# Patient Record
Sex: Female | Born: 1980
Health system: Southern US, Community
[De-identification: ages and names within clinical notes are randomized; demographics above are authoritative.]

## PROBLEM LIST (undated history)

## (undated) HISTORY — PX: WRIST SURGERY: SHX841

---

## 2013-06-15 ENCOUNTER — Encounter (HOSPITAL_COMMUNITY): Payer: Self-pay | Admitting: Emergency Medicine

## 2013-06-15 ENCOUNTER — Emergency Department (HOSPITAL_COMMUNITY)
Admission: EM | Admit: 2013-06-15 | Discharge: 2013-06-15 | Disposition: A | Discharge status: Home / Self Care | Payer: Managed Care, Other (non HMO) | Attending: Emergency Medicine | Admitting: Emergency Medicine

## 2013-06-15 DIAGNOSIS — T368X5A Adverse effect of other systemic antibiotics, initial encounter: Secondary | ICD-10-CM | POA: Insufficient documentation

## 2013-06-15 DIAGNOSIS — F17200 Nicotine dependence, unspecified, uncomplicated: Secondary | ICD-10-CM | POA: Insufficient documentation

## 2013-06-15 DIAGNOSIS — R000 Tachycardia, unspecified: Secondary | ICD-10-CM | POA: Insufficient documentation

## 2013-06-15 DIAGNOSIS — R221 Localized swelling, mass and lump, neck: Principal | ICD-10-CM

## 2013-06-15 DIAGNOSIS — T7840XA Allergy, unspecified, initial encounter: Secondary | ICD-10-CM

## 2013-06-15 DIAGNOSIS — R220 Localized swelling, mass and lump, head: Secondary | ICD-10-CM | POA: Insufficient documentation

## 2013-06-15 DIAGNOSIS — IMO0002 Reserved for concepts with insufficient information to code with codable children: Secondary | ICD-10-CM | POA: Insufficient documentation

## 2013-06-15 DIAGNOSIS — L299 Pruritus, unspecified: Secondary | ICD-10-CM | POA: Insufficient documentation

## 2013-06-15 MED ORDER — FAMOTIDINE 20 MG PO TABS: 20.0000 mg | ORAL_TABLET | Freq: Two times a day (BID) | ORAL | Status: DC

## 2013-06-15 MED ORDER — PREDNISONE 20 MG PO TABS
60.0000 mg | ORAL_TABLET | Freq: Once | ORAL | Status: AC
  Administered 2013-06-15: 60 mg via ORAL
  Filled 2013-06-15: qty 3

## 2013-06-15 MED ORDER — FAMOTIDINE 20 MG PO TABS
40.0000 mg | ORAL_TABLET | Freq: Once | ORAL | Status: AC
  Administered 2013-06-15: 40 mg via ORAL
  Filled 2013-06-15: qty 2

## 2013-06-15 MED ORDER — PREDNISONE 10 MG PO TABS: 20.0000 mg | ORAL_TABLET | Freq: Every day | ORAL | Status: DC

## 2013-06-15 NOTE — ED Notes (Signed)
Pt finished taking Amoxicillin for sinus infection yesterday. Began having hives and itching yesterday. Pt was seen at urgent care, declined prednisone. Benadryl taken 30 min ago.

## 2013-06-15 NOTE — Discharge Instructions (Signed)
Allergies °Allergies may happen from anything your body is sensitive to. This may be food, medicines, pollens, chemicals, and nearly anything around you in everyday life that produces allergens. An allergen is anything that causes an allergy producing substance. Heredity is often a factor in causing these problems. This means you may have some of the same allergies as your parents. °Food allergies happen in all age groups. Food allergies are some of the most severe and life threatening. Some common food allergies are cow's milk, seafood, eggs, nuts, wheat, and soybeans. °SYMPTOMS  °· Swelling around the mouth. °· An itchy red rash or hives. °· Vomiting or diarrhea. °· Difficulty breathing. °SEVERE ALLERGIC REACTIONS ARE LIFE-THREATENING. °This reaction is called anaphylaxis. It can cause the mouth and throat to swell and cause difficulty with breathing and swallowing. In severe reactions only a trace amount of food (for example, peanut oil in a salad) may cause death within seconds. °Seasonal allergies occur in all age groups. These are seasonal because they usually occur during the same season every year. They may be a reaction to molds, grass pollens, or tree pollens. Other causes of problems are house dust mite allergens, pet dander, and mold spores. The symptoms often consist of nasal congestion, a runny itchy nose associated with sneezing, and tearing itchy eyes. There is often an associated itching of the mouth and ears. The problems happen when you come in contact with pollens and other allergens. Allergens are the particles in the air that the body reacts to with an allergic reaction. This causes you to release allergic antibodies. Through a chain of events, these eventually cause you to release histamine into the blood stream. Although it is meant to be protective to the body, it is this release that causes your discomfort. This is why you were given anti-histamines to feel better.  If you are unable to  pinpoint the offending allergen, it may be determined by skin or blood testing. Allergies cannot be cured but can be controlled with medicine. °Hay fever is a collection of all or some of the seasonal allergy problems. It may often be treated with simple over-the-counter medicine such as diphenhydramine. Take medicine as directed. Do not drink alcohol or drive while taking this medicine. Check with your caregiver or package insert for child dosages. °If these medicines are not effective, there are many new medicines your caregiver can prescribe. Stronger medicine such as nasal spray, eye drops, and corticosteroids may be used if the first things you try do not work well. Other treatments such as immunotherapy or desensitizing injections can be used if all else fails. Follow up with your caregiver if problems continue. These seasonal allergies are usually not life threatening. They are generally more of a nuisance that can often be handled using medicine. °HOME CARE INSTRUCTIONS  °· If unsure what causes a reaction, keep a diary of foods eaten and symptoms that follow. Avoid foods that cause reactions. °· If hives or rash are present: °· Take medicine as directed. °· You may use an over-the-counter antihistamine (diphenhydramine) for hives and itching as needed. °· Apply cold compresses (cloths) to the skin or take baths in cool water. Avoid hot baths or showers. Heat will make a rash and itching worse. °· If you are severely allergic: °· Following a treatment for a severe reaction, hospitalization is often required for closer follow-up. °· Wear a medic-alert bracelet or necklace stating the allergy. °· You and your family must learn how to give adrenaline or use   an anaphylaxis kit. °· If you have had a severe reaction, always carry your anaphylaxis kit or EpiPen® with you. Use this medicine as directed by your caregiver if a severe reaction is occurring. Failure to do so could have a fatal outcome. °SEEK MEDICAL  CARE IF: °· You suspect a food allergy. Symptoms generally happen within 30 minutes of eating a food. °· Your symptoms have not gone away within 2 days or are getting worse. °· You develop new symptoms. °· You want to retest yourself or your child with a food or drink you think causes an allergic reaction. Never do this if an anaphylactic reaction to that food or drink has happened before. Only do this under the care of a caregiver. °SEEK IMMEDIATE MEDICAL CARE IF:  °· You have difficulty breathing, are wheezing, or have a tight feeling in your chest or throat. °· You have a swollen mouth, or you have hives, swelling, or itching all over your body. °· You have had a severe reaction that has responded to your anaphylaxis kit or an EpiPen®. These reactions may return when the medicine has worn off. These reactions should be considered life threatening. °MAKE SURE YOU:  °· Understand these instructions. °· Will watch your condition. °· Will get help right away if you are not doing well or get worse. °Document Released: 07/20/2002 Document Revised: 08/21/2012 Document Reviewed: 12/25/2007 °[employer]® Patient Information ©2014 [employer]. ° °

## 2013-06-15 NOTE — ED Provider Notes (Signed)
CSN: [id number]     Arrival date & time 06/15/13  2142 History   First MD Initiated Contact with Patient 06/15/13 2224     Chief Complaint  Patient presents with  . Allergic Reaction   (Consider location/radiation/quality/duration/timing/severity/associated sxs/prior Treatment) Patient is a 33 y.o. female presenting with allergic reaction. The history is provided by the patient.  Allergic Reaction  patient here complaining of pruritus and allergic reaction secondary to his amoxicillin. Seen in urgent care Center and declined to take prednisone. Tonight the itching became worse and associated with some swelling around her eyes and she took Benadryl with some relief of her symptoms. Denies any trouble swallowing or trouble breathing. No prior history of same.  History reviewed. No pertinent past medical history. History reviewed. No pertinent past surgical history. No family history on file. History  Substance Use Topics  . Smoking status: Current Some Day Smoker  . Smokeless tobacco: Not on file  . Alcohol Use: Yes     Comment: occasional   OB History   Grav Para Term Preterm Abortions TAB SAB Ect Mult Living                 Review of Systems  All other systems reviewed and are negative.    Allergies  Amoxicillin  Home Medications   Current Outpatient Rx  Name  Route  Sig  Dispense  Refill  . diphenhydrAMINE (BENADRYL) 25 mg capsule   Oral   Take 25 mg by mouth every 6 (six) hours as needed.         . fluticasone (FLONASE) 50 MCG/ACT nasal spray   Each Nare   Place 1 spray into both nostrils daily.         . Multiple Vitamin (MULTIVITAMIN WITH MINERALS) TABS tablet   Oral   Take 1 tablet by mouth daily.          BP 135/84  Pulse 105  Temp(Src) 99.1 F (37.3 C) (Oral)  Resp 20  Wt 155 lb (70.308 kg)  SpO2 98%  LMP 05/25/2013 Physical Exam  Nursing note and vitals reviewed. Constitutional: She is oriented to person, place, and time. She appears  well-developed and well-nourished.  Non-toxic appearance. No distress.  HENT:  Head: Normocephalic and atraumatic.  Eyes: Conjunctivae, EOM and lids are normal. Pupils are equal, round, and reactive to light.  Neck: Normal range of motion. Neck supple. No tracheal deviation present. No mass present.  Cardiovascular: Regular rhythm and normal heart sounds.  Tachycardia present.  Exam reveals no gallop.   No murmur heard. Pulmonary/Chest: Effort normal and breath sounds normal. No stridor. No respiratory distress. She has no decreased breath sounds. She has no wheezes. She has no rhonchi. She has no rales.  Abdominal: Soft. Normal appearance and bowel sounds are normal. She exhibits no distension. There is no tenderness. There is no rebound and no CVA tenderness.  Musculoskeletal: Normal range of motion. She exhibits no edema and no tenderness.  Neurological: She is alert and oriented to person, place, and time. She has normal strength. No cranial nerve deficit or sensory deficit. GCS eye subscore is 4. GCS verbal subscore is 5. GCS motor subscore is 6.  Skin: Skin is warm and dry. No abrasion and no rash noted.  Psychiatric: She has a normal mood and affect. Her speech is normal and behavior is normal.    ED Course  Procedures (including critical care time) Labs Review Labs Reviewed - No data to display Imaging Review  No results found.  EKG Interpretation   None       MDM  No diagnosis found. Patient given prednisone and Pepcid here. No signs of airway compromise and is stable for discharge    Toy BakerAnthony T Allen, MD 06/15/13 2234

## 2013-09-06 ENCOUNTER — Other Ambulatory Visit: Payer: Self-pay | Admitting: Orthopedic Surgery

## 2013-09-06 DIAGNOSIS — M25531 Pain in right wrist: Secondary | ICD-10-CM

## 2013-09-11 ENCOUNTER — Other Ambulatory Visit: Payer: Managed Care, Other (non HMO)

## 2013-09-12 ENCOUNTER — Ambulatory Visit
Admission: RE | Admit: 2013-09-12 | Discharge: 2013-09-12 | Disposition: A | Discharge status: Home / Self Care | Payer: Managed Care, Other (non HMO) | Source: Ambulatory Visit | Attending: Orthopedic Surgery | Admitting: Orthopedic Surgery

## 2013-09-12 DIAGNOSIS — M25531 Pain in right wrist: Secondary | ICD-10-CM

## 2014-02-28 ENCOUNTER — Observation Stay (HOSPITAL_COMMUNITY)
Admission: EM | Admit: 2014-02-28 | Discharge: 2014-03-01 | Disposition: A | Discharge status: Home / Self Care | Payer: Managed Care, Other (non HMO) | Attending: Surgery | Admitting: Surgery

## 2014-02-28 ENCOUNTER — Emergency Department (HOSPITAL_COMMUNITY): Payer: Managed Care, Other (non HMO)

## 2014-02-28 ENCOUNTER — Encounter (HOSPITAL_COMMUNITY): Payer: Self-pay | Admitting: Emergency Medicine

## 2014-02-28 DIAGNOSIS — Z881 Allergy status to other antibiotic agents status: Secondary | ICD-10-CM | POA: Diagnosis not present

## 2014-02-28 DIAGNOSIS — Z885 Allergy status to narcotic agent status: Secondary | ICD-10-CM | POA: Diagnosis not present

## 2014-02-28 DIAGNOSIS — R1031 Right lower quadrant pain: Secondary | ICD-10-CM | POA: Diagnosis present

## 2014-02-28 DIAGNOSIS — F17210 Nicotine dependence, cigarettes, uncomplicated: Secondary | ICD-10-CM | POA: Insufficient documentation

## 2014-02-28 DIAGNOSIS — K3580 Unspecified acute appendicitis: Principal | ICD-10-CM | POA: Insufficient documentation

## 2014-02-28 DIAGNOSIS — Z888 Allergy status to other drugs, medicaments and biological substances status: Secondary | ICD-10-CM | POA: Diagnosis not present

## 2014-02-28 DIAGNOSIS — Z880 Allergy status to penicillin: Secondary | ICD-10-CM | POA: Insufficient documentation

## 2014-02-28 LAB — POC URINE PREG, ED: Preg Test, Ur: NEGATIVE

## 2014-02-28 LAB — COMPREHENSIVE METABOLIC PANEL
ALT: 14 U/L (ref 0–35)
AST: 17 U/L (ref 0–37)
Albumin: 4 g/dL (ref 3.5–5.2)
Alkaline Phosphatase: 40 U/L (ref 39–117)
Anion gap: 12 (ref 5–15)
BUN: 12 mg/dL (ref 6–23)
CO2: 25 mEq/L (ref 19–32)
Calcium: 9.3 mg/dL (ref 8.4–10.5)
Chloride: 103 mEq/L (ref 96–112)
Creatinine, Ser: 0.63 mg/dL (ref 0.50–1.10)
GFR calc Af Amer: >90 mL/min (ref >90)
GFR calc non Af Amer: >90 mL/min (ref >90)
Glucose, Bld: 93 mg/dL (ref 70–99)
Potassium: 4.2 mEq/L (ref 3.7–5.3)
Sodium: 140 mEq/L (ref 137–147)
Total Bilirubin: 0.7 mg/dL (ref 0.3–1.2)
Total Protein: 7 g/dL (ref 6.0–8.3)

## 2014-02-28 LAB — CBC WITH DIFFERENTIAL/PLATELET
Basophils Absolute: 0 10*3/uL (ref 0.0–0.1)
Basophils Relative: 0 % (ref 0–1)
Eosinophils Absolute: 0.1 10*3/uL (ref 0.0–0.7)
Eosinophils Relative: 1 % (ref 0–5)
HCT: 39.6 % (ref 36.0–46.0)
Hemoglobin: 13.6 g/dL (ref 12.0–15.0)
Lymphocytes Relative: 24 % (ref 12–46)
Lymphs Abs: 2.6 10*3/uL (ref 0.7–4.0)
MCH: 30.4 pg (ref 26.0–34.0)
MCHC: 34.3 g/dL (ref 30.0–36.0)
MCV: 88.6 fL (ref 78.0–100.0)
Monocytes Absolute: 0.7 10*3/uL (ref 0.1–1.0)
Monocytes Relative: 6 % (ref 3–12)
Neutro Abs: 7.4 10*3/uL (ref 1.7–7.7)
Neutrophils Relative %: 69 % (ref 43–77)
Platelets: 233 10*3/uL (ref 150–400)
RBC: 4.47 MIL/uL (ref 3.87–5.11)
RDW: 12.7 % (ref 11.5–15.5)
WBC: 10.8 10*3/uL — ABNORMAL HIGH (ref 4.0–10.5)

## 2014-02-28 LAB — URINALYSIS, ROUTINE W REFLEX MICROSCOPIC
Bilirubin Urine: NEGATIVE
Glucose, UA: NEGATIVE mg/dL
Hgb urine dipstick: NEGATIVE
Ketones, ur: NEGATIVE mg/dL
Leukocytes, UA: NEGATIVE
Nitrite: NEGATIVE
Protein, ur: NEGATIVE mg/dL
Specific Gravity, Urine: 1.015 (ref 1.005–1.030)
Urobilinogen, UA: 0.2 mg/dL (ref 0.0–1.0)
pH: 5.5 (ref 5.0–8.0)

## 2014-02-28 LAB — LIPASE, BLOOD: Lipase: 33 U/L (ref 11–59)

## 2014-02-28 MED ORDER — SODIUM CHLORIDE 0.9 % IV BOLUS (SEPSIS)
1000.0000 mL | Freq: Once | INTRAVENOUS | Status: AC
  Administered 2014-02-28: 1000 mL via INTRAVENOUS

## 2014-02-28 MED ORDER — IOHEXOL 300 MG/ML  SOLN
50.0000 mL | Freq: Once | INTRAMUSCULAR | Status: AC | PRN
  Administered 2014-02-28: 50 mL via ORAL

## 2014-02-28 MED ORDER — KETOROLAC TROMETHAMINE 30 MG/ML IJ SOLN
15.0000 mg | Freq: Once | INTRAMUSCULAR | Status: AC
  Administered 2014-02-28: 15 mg via INTRAVENOUS
  Filled 2014-02-28: qty 1

## 2014-02-28 MED ORDER — ONDANSETRON HCL 4 MG/2ML IJ SOLN
4.0000 mg | Freq: Once | INTRAMUSCULAR | Status: AC
  Administered 2014-02-28: 4 mg via INTRAVENOUS
  Filled 2014-02-28: qty 2

## 2014-02-28 MED ORDER — MORPHINE SULFATE 2 MG/ML IJ SOLN: 2.0000 mg | Freq: Once | INTRAMUSCULAR | Status: DC

## 2014-02-28 NOTE — ED Notes (Signed)
Pt states she has been having RLQ for 3 days  Pt went to urgent care and was told to come here for further evaluation to R/O appendicitis

## 2014-02-28 NOTE — ED Provider Notes (Signed)
CSN: [id number]     Arrival date & time 02/28/14  2128 History   First MD Initiated Contact with Patient 02/28/14 2132     Chief Complaint  Patient presents with  . Abdominal Pain     (Consider location/radiation/quality/duration/timing/severity/associated sxs/prior Treatment) The history is provided by the patient. No language interpreter was used.  Amy Golden is a 33 y/o F with no known significant PMHX presenting to the ED with abdominal pain localized to the RLQ for the past 3 days described as a stabbing pain that is constant with mild radiation to the back. Patient reported that she has been having nausea and decreased appetite. Patient reported that she has not taken anything for the pain. Stated that her LMP was 2 weeks ago. Stated that she was seen and assessed by Urgent Care today who recommended patient to come to the ED to be ruled out for appendicitis. Denied fever, chills, neck pain, neck stiffness, vomiting, diarrhea, melena, hematochezia, chest pain, shortness of breath, difficulty breathing, hematuria, vaginal bleeding, vaginal discharge, vaginal complaints. PCP Dr. Duanne Guess  History reviewed. No pertinent past medical history. Past Surgical History  Procedure Laterality Date  . Wrist surgery     History reviewed. No pertinent family history. History  Substance Use Topics  . Smoking status: Current Some Day Smoker    Types: Cigarettes  . Smokeless tobacco: Not on file  . Alcohol Use: Yes     Comment: occasional   OB History   Grav Para Term Preterm Abortions TAB SAB Ect Mult Living                 Review of Systems  Constitutional: Negative for fever and chills.  Respiratory: Negative for chest tightness and shortness of breath.   Cardiovascular: Negative for chest pain.  Gastrointestinal: Positive for nausea and abdominal pain. Negative for vomiting, diarrhea, constipation, blood in stool and anal bleeding.  Genitourinary: Negative for vaginal bleeding, vaginal  discharge and vaginal pain.  Musculoskeletal: Negative for neck pain and neck stiffness.  Neurological: Negative for dizziness, weakness and headaches.      Allergies  Codeine; Penicillins; Wellbutrin; and Amoxicillin  Home Medications   Prior to Admission medications   Medication Sig Start Date End Date Taking? Authorizing Provider  Ascorbic Acid (VITAMIN C PO) Take 1,000 mg by mouth daily.   Yes Historical Provider, MD  b complex vitamins tablet Take 1 tablet by mouth daily.   Yes Historical Provider, MD  BIOTIN PO Take 10,000 mg by mouth daily.   Yes Historical Provider, MD  fexofenadine (ALLEGRA) 180 MG tablet Take 180 mg by mouth daily.   Yes Historical Provider, MD  MAGNESIUM PO Take 1 tablet by mouth daily.   Yes Historical Provider, MD  triamcinolone (NASACORT) 55 MCG/ACT AERO nasal inhaler Place 2 sprays into both nostrils daily.   Yes Historical Provider, MD  Vitamins A & D (VITAMIN A & D) [address] UNITS TABS Take 1 tablet by mouth daily.   Yes Historical Provider, MD   BP 128/77  Pulse 87  Temp(Src) 98.2 F (36.8 C) (Oral)  Resp 15  SpO2 100%  LMP 02/10/2014 Physical Exam  Nursing note and vitals reviewed. Constitutional: She is oriented to person, place, and time. She appears well-developed and well-nourished. No distress.  HENT:  Head: Normocephalic and atraumatic.  Eyes: Conjunctivae and EOM are normal. Pupils are equal, round, and reactive to light. Right eye exhibits no discharge. Left eye exhibits no discharge.  Neck: Normal range of  motion. Neck supple. No tracheal deviation present.  Cardiovascular: Normal rate, regular rhythm and normal heart sounds.  Exam reveals no friction rub.   No murmur heard. Pulses:      Radial pulses are 2+ on the right side, and 2+ on the left side.       Dorsalis pedis pulses are 2+ on the right side, and 2+ on the left side.  Pulmonary/Chest: Effort normal and breath sounds normal. No respiratory distress. She has no wheezes.  She has no rales. She exhibits no tenderness.  Patient is able to speak in full sentences without difficulty  Negative use of accessory muscles Negative stridor  Abdominal: Soft. Bowel sounds are normal. She exhibits no distension. There is tenderness in the right lower quadrant. There is rebound.  Negative abdominal distention Bowel sounds normal active in all 4 quadrants Abdomen soft upon palpation Discomfort upon palpation to the right lower quadrant, positive Rovsing sign Negative psoas and obturator Voluntary guarding Negative peritoneal signs  Musculoskeletal: Normal range of motion. She exhibits no edema and no tenderness.  Lymphadenopathy:    She has no cervical adenopathy.  Neurological: She is alert and oriented to person, place, and time. No cranial nerve deficit. She exhibits normal muscle tone. Coordination normal.  Skin: Skin is warm and dry. No rash noted. She is not diaphoretic. No erythema.  Psychiatric: She has a normal mood and affect. Her behavior is normal. Thought content normal.    ED Course  Procedures (including critical care time)  Results for orders placed during the hospital encounter of 02/28/14  CBC WITH DIFFERENTIAL      Result Value Ref Range   WBC 10.8 (*) 4.0 - 10.5 K/uL   RBC 4.47  3.87 - 5.11 MIL/uL   Hemoglobin 13.6  12.0 - 15.0 g/dL   HCT 16.139.6  09.636.0 - 04.546.0 %   MCV 88.6  78.0 - 100.0 fL   MCH 30.4  26.0 - 34.0 pg   MCHC 34.3  30.0 - 36.0 g/dL   RDW 40.912.7  81.111.5 - 91.415.5 %   Platelets 233  150 - 400 K/uL   Neutrophils Relative % 69  43 - 77 %   Neutro Abs 7.4  1.7 - 7.7 K/uL   Lymphocytes Relative 24  12 - 46 %   Lymphs Abs 2.6  0.7 - 4.0 K/uL   Monocytes Relative 6  3 - 12 %   Monocytes Absolute 0.7  0.1 - 1.0 K/uL   Eosinophils Relative 1  0 - 5 %   Eosinophils Absolute 0.1  0.0 - 0.7 K/uL   Basophils Relative 0  0 - 1 %   Basophils Absolute 0.0  0.0 - 0.1 K/uL  COMPREHENSIVE METABOLIC PANEL      Result Value Ref Range   Sodium 140  137  - 147 mEq/L   Potassium 4.2  3.7 - 5.3 mEq/L   Chloride 103  96 - 112 mEq/L   CO2 25  19 - 32 mEq/L   Glucose, Bld 93  70 - 99 mg/dL   BUN 12  6 - 23 mg/dL   Creatinine, Ser 7.820.63  0.50 - 1.10 mg/dL   Calcium 9.3  8.4 - 95.610.5 mg/dL   Total Protein 7.0  6.0 - 8.3 g/dL   Albumin 4.0  3.5 - 5.2 g/dL   AST 17  0 - 37 U/L   ALT 14  0 - 35 U/L   Alkaline Phosphatase 40  39 - 117 U/L  Total Bilirubin 0.7  0.3 - 1.2 mg/dL   GFR calc non Af Amer >90  >90 mL/min   GFR calc Af Amer >90  >90 mL/min   Anion gap 12  5 - 15  URINALYSIS, ROUTINE W REFLEX MICROSCOPIC      Result Value Ref Range   Color, Urine YELLOW  YELLOW   APPearance CLOUDY (*) CLEAR   Specific Gravity, Urine 1.015  1.005 - 1.030   pH 5.5  5.0 - 8.0   Glucose, UA NEGATIVE  NEGATIVE mg/dL   Hgb urine dipstick NEGATIVE  NEGATIVE   Bilirubin Urine NEGATIVE  NEGATIVE   Ketones, ur NEGATIVE  NEGATIVE mg/dL   Protein, ur NEGATIVE  NEGATIVE mg/dL   Urobilinogen, UA 0.2  0.0 - 1.0 mg/dL   Nitrite NEGATIVE  NEGATIVE   Leukocytes, UA NEGATIVE  NEGATIVE  LIPASE, BLOOD      Result Value Ref Range   Lipase 33  11 - 59 U/L  POC URINE PREG, ED      Result Value Ref Range   Preg Test, Ur NEGATIVE  NEGATIVE    Labs Review Labs Reviewed  CBC WITH DIFFERENTIAL - Abnormal; Notable for the following:    WBC 10.8 (*)    All other components within normal limits  URINALYSIS, ROUTINE W REFLEX MICROSCOPIC - Abnormal; Notable for the following:    APPearance CLOUDY (*)    All other components within normal limits  COMPREHENSIVE METABOLIC PANEL  LIPASE, BLOOD  POC URINE PREG, ED    Imaging Review Ct Abdomen Pelvis W Contrast  03/01/2014   CLINICAL DATA:  Right lower quadrant pain for 3 days. White cell count 10.8.  EXAM: CT ABDOMEN AND PELVIS WITH CONTRAST  TECHNIQUE: Multidetector CT imaging of the abdomen and pelvis was performed using the standard protocol following bolus administration of intravenous contrast.  CONTRAST:  50mL  OMNIPAQUE IOHEXOL 300 MG/ML  SOLN  COMPARISON:  None.  FINDINGS: Mild dependent changes in the lung bases. Multiple small scattered low-attenuation lesions in the liver. The largest is in segment 7, measuring 17 mm maximal diameter. The largest lesion demonstrates peripheral nodular contrast filling suggesting hemangioma. Additional lesions are too small to characterize and could represent cysts or hemangiomas. The gallbladder, spleen, pancreas, adrenal glands, kidneys, abdominal aorta, inferior vena cava, and retroperitoneal lymph nodes are unremarkable. Stomach is decompressed. Small bowel is not abnormally distended. Stool-filled colon without distention. No free air or free fluid in the abdomen.  Pelvis: The appendix extends medially from the cecal tip and is mildly distended and fluid filled with periappendiceal fat stranding. Changes are consistent with early acute appendicitis. No abscess. Small amount of free fluid in the pelvis may be reactive or physiologic. Uterus and ovaries are not enlarged. Bladder wall is not thickened. No evidence of diverticulitis. No destructive bone lesions.  IMPRESSION: Mild distention of fluid-filled appendix with periappendiceal stranding consistent with early appendicitis. No abscess. Small amount of free fluid in the pelvis may be physiologic or reactive.   Electronically Signed   By: Burman NievesWilliam  Stevens M.D.   On: 03/01/2014 01:12     EKG Interpretation None     1:28 AM This provider spoke with Dr. Michaell CowingGross, [facility] - discussed case, labs, imaging in great detail. Surgeon to come and assess patient.   MDM   Final diagnoses:  RLQ abdominal pain  Acute appendicitis, unspecified acute appendicitis type    Medications  ondansetron (ZOFRAN) injection 4 mg (4 mg Intravenous Given 02/28/14 2310)  sodium chloride 0.9 % bolus 1,000 mL (0 mLs Intravenous Stopped 03/01/14 0001)  sodium chloride 0.9 % bolus 1,000 mL (0 mLs Intravenous Stopped 03/01/14 0001)  ketorolac  (TORADOL) 30 MG/ML injection 15 mg (15 mg Intravenous Given 02/28/14 2356)  iohexol (OMNIPAQUE) 300 MG/ML solution 50 mL (50 mLs Oral Contrast Given 02/28/14 2354)  iohexol (OMNIPAQUE) 300 MG/ML solution 100 mL (100 mLs Intravenous Contrast Given 03/01/14 0103)   Filed Vitals:   02/28/14 2132 02/28/14 2336  BP: 120/83 128/77  Pulse: 110 87  Temp: 98.2 F (36.8 C)   TempSrc: Oral   Resp: 20 15  SpO2: 99% 100%   CBC noted mildly elevated white blood cell count 10.8. CMP unremarkable. Lipase negative elevation. Urine pregnancy negative. Urinalysis negative for infection. CT abdomen and pelvis with contrast identified mild distention of fluid-filled appendix with periappendiceal stranding consistent with early appendicitis-negative signs of rupture or abscess noted. Patient presenting to the ED with early appendicitis. Patient stable, afebrile. This provider spoke with [facility] to come and assess patient. Patient to be admitted to [facility].   Raymon Mutton, PA-C 03/01/14 [phone]

## 2014-03-01 ENCOUNTER — Observation Stay (HOSPITAL_COMMUNITY): Payer: Managed Care, Other (non HMO) | Admitting: *Deleted

## 2014-03-01 ENCOUNTER — Encounter (HOSPITAL_COMMUNITY): Payer: Self-pay | Admitting: General Practice

## 2014-03-01 ENCOUNTER — Emergency Department (HOSPITAL_COMMUNITY): Payer: Managed Care, Other (non HMO)

## 2014-03-01 ENCOUNTER — Encounter (HOSPITAL_COMMUNITY)
Admission: EM | Disposition: A | Discharge status: Home / Self Care | Payer: Self-pay | Source: Home / Self Care | Attending: Emergency Medicine

## 2014-03-01 ENCOUNTER — Encounter (HOSPITAL_COMMUNITY): Payer: Managed Care, Other (non HMO) | Admitting: *Deleted

## 2014-03-01 DIAGNOSIS — K3580 Unspecified acute appendicitis: Secondary | ICD-10-CM | POA: Diagnosis present

## 2014-03-01 HISTORY — PX: LAPAROSCOPIC APPENDECTOMY: SHX408

## 2014-03-01 LAB — SURGICAL PCR SCREEN
MRSA, PCR: NEGATIVE
Staphylococcus aureus: NEGATIVE

## 2014-03-01 SURGERY — APPENDECTOMY, LAPAROSCOPIC
Anesthesia: General | Site: Abdomen

## 2014-03-01 MED ORDER — LACTATED RINGERS IV SOLN
INTRAVENOUS | Status: DC
  Administered 2014-03-01: 11:00:00 via INTRAVENOUS

## 2014-03-01 MED ORDER — TRIAMCINOLONE ACETONIDE 55 MCG/ACT NA AERO: 2.0000 | INHALATION_SPRAY | Freq: Every day | NASAL | Status: DC

## 2014-03-01 MED ORDER — DIPHENHYDRAMINE HCL 50 MG/ML IJ SOLN
INTRAMUSCULAR | Status: AC
  Filled 2014-03-01: qty 1

## 2014-03-01 MED ORDER — METRONIDAZOLE IN NACL 5-0.79 MG/ML-% IV SOLN
500.0000 mg | Freq: Four times a day (QID) | INTRAVENOUS | Status: DC
  Administered 2014-03-01: 500 mg via INTRAVENOUS
  Filled 2014-03-01 (×3): qty 100

## 2014-03-01 MED ORDER — KCL IN DEXTROSE-NACL 40-5-0.45 MEQ/L-%-% IV SOLN
INTRAVENOUS | Status: DC
  Administered 2014-03-01: 04:00:00 via INTRAVENOUS
  Filled 2014-03-01 (×2): qty 1000

## 2014-03-01 MED ORDER — ROCURONIUM BROMIDE 100 MG/10ML IV SOLN
INTRAVENOUS | Status: AC
  Filled 2014-03-01: qty 1

## 2014-03-01 MED ORDER — HYDROMORPHONE HCL 1 MG/ML IJ SOLN: 0.2500 mg | INTRAMUSCULAR | Status: DC | PRN

## 2014-03-01 MED ORDER — VITAMIN C 500 MG PO TABS
500.0000 mg | ORAL_TABLET | Freq: Two times a day (BID) | ORAL | Status: DC
  Filled 2014-03-01 (×3): qty 1

## 2014-03-01 MED ORDER — ONDANSETRON HCL 4 MG/2ML IJ SOLN: 4.0000 mg | Freq: Four times a day (QID) | INTRAMUSCULAR | Status: DC | PRN

## 2014-03-01 MED ORDER — TRAMADOL HCL 50 MG PO TABS
50.0000 mg | ORAL_TABLET | Freq: Four times a day (QID) | ORAL | Status: DC | PRN
  Administered 2014-03-01: 100 mg via ORAL
  Filled 2014-03-01: qty 2

## 2014-03-01 MED ORDER — ONDANSETRON 8 MG/NS 50 ML IVPB
8.0000 mg | Freq: Four times a day (QID) | INTRAVENOUS | Status: DC | PRN
  Filled 2014-03-01: qty 8

## 2014-03-01 MED ORDER — LACTATED RINGERS IV BOLUS (SEPSIS): 1000.0000 mL | Freq: Once | INTRAVENOUS | Status: DC

## 2014-03-01 MED ORDER — PROPOFOL 10 MG/ML IV BOLUS
INTRAVENOUS | Status: DC | PRN
  Administered 2014-03-01: 170 mg via INTRAVENOUS

## 2014-03-01 MED ORDER — ONDANSETRON HCL 4 MG/2ML IJ SOLN
INTRAMUSCULAR | Status: DC | PRN
  Administered 2014-03-01: 4 mg via INTRAVENOUS

## 2014-03-01 MED ORDER — LORATADINE 10 MG PO TABS
10.0000 mg | ORAL_TABLET | Freq: Every day | ORAL | Status: DC
  Filled 2014-03-01: qty 1

## 2014-03-01 MED ORDER — DIPHENHYDRAMINE HCL 50 MG/ML IJ SOLN
12.5000 mg | Freq: Four times a day (QID) | INTRAMUSCULAR | Status: DC | PRN
  Administered 2014-03-01: 25 mg via INTRAVENOUS
  Filled 2014-03-01 (×2): qty 1

## 2014-03-01 MED ORDER — LIP MEDEX EX OINT
1.0000 "application " | TOPICAL_OINTMENT | Freq: Two times a day (BID) | CUTANEOUS | Status: DC
  Filled 2014-03-01: qty 7

## 2014-03-01 MED ORDER — LIDOCAINE HCL (CARDIAC) 20 MG/ML IV SOLN
INTRAVENOUS | Status: DC | PRN
  Administered 2014-03-01: 60 mg via INTRAVENOUS

## 2014-03-01 MED ORDER — OXYCODONE HCL 5 MG PO TABS: 5.0000 mg | ORAL_TABLET | Freq: Four times a day (QID) | ORAL | Status: DC | PRN

## 2014-03-01 MED ORDER — MIDAZOLAM HCL 2 MG/2ML IJ SOLN
INTRAMUSCULAR | Status: AC
  Filled 2014-03-01: qty 2

## 2014-03-01 MED ORDER — FLUTICASONE PROPIONATE 50 MCG/ACT NA SUSP
2.0000 | Freq: Every day | NASAL | Status: DC
  Filled 2014-03-01: qty 16

## 2014-03-01 MED ORDER — DIPHENHYDRAMINE HCL 12.5 MG/5ML PO ELIX
12.5000 mg | ORAL_SOLUTION | Freq: Four times a day (QID) | ORAL | Status: DC | PRN
  Administered 2014-03-01: 12.5 mg via ORAL

## 2014-03-01 MED ORDER — HEPARIN SODIUM (PORCINE) 5000 UNIT/ML IJ SOLN
5000.0000 [IU] | Freq: Three times a day (TID) | INTRAMUSCULAR | Status: DC
  Filled 2014-03-01: qty 1

## 2014-03-01 MED ORDER — CHLORHEXIDINE GLUCONATE 4 % EX LIQD
1.0000 "application " | Freq: Once | CUTANEOUS | Status: DC
  Filled 2014-03-01: qty 15

## 2014-03-01 MED ORDER — ALUM & MAG HYDROXIDE-SIMETH 200-200-20 MG/5ML PO SUSP: 30.0000 mL | Freq: Four times a day (QID) | ORAL | Status: DC | PRN

## 2014-03-01 MED ORDER — METOCLOPRAMIDE HCL 5 MG/ML IJ SOLN
INTRAMUSCULAR | Status: DC | PRN
  Administered 2014-03-01: 10 mg via INTRAVENOUS

## 2014-03-01 MED ORDER — TRAMADOL HCL 50 MG PO TABS: 50.0000 mg | ORAL_TABLET | Freq: Four times a day (QID) | ORAL | Status: DC | PRN

## 2014-03-01 MED ORDER — CIPROFLOXACIN IN D5W 400 MG/200ML IV SOLN
400.0000 mg | Freq: Two times a day (BID) | INTRAVENOUS | Status: DC
  Administered 2014-03-01: 400 mg via INTRAVENOUS
  Filled 2014-03-01: qty 200

## 2014-03-01 MED ORDER — ZOLPIDEM TARTRATE 5 MG PO TABS: 5.0000 mg | ORAL_TABLET | Freq: Every evening | ORAL | Status: DC | PRN

## 2014-03-01 MED ORDER — OXYCODONE HCL 5 MG PO TABS: 5.0000 mg | ORAL_TABLET | ORAL | Status: DC | PRN

## 2014-03-01 MED ORDER — IOHEXOL 300 MG/ML  SOLN
100.0000 mL | Freq: Once | INTRAMUSCULAR | Status: AC | PRN
  Administered 2014-03-01: 100 mL via INTRAVENOUS

## 2014-03-01 MED ORDER — HYDROMORPHONE HCL 1 MG/ML IJ SOLN
0.5000 mg | INTRAMUSCULAR | Status: DC | PRN
  Administered 2014-03-01 (×3): 1 mg via INTRAVENOUS
  Filled 2014-03-01 (×3): qty 1

## 2014-03-01 MED ORDER — DOCUSATE SODIUM 100 MG PO CAPS: 100.0000 mg | ORAL_CAPSULE | Freq: Two times a day (BID) | ORAL | Status: DC | PRN

## 2014-03-01 MED ORDER — SUCCINYLCHOLINE CHLORIDE 20 MG/ML IJ SOLN
INTRAMUSCULAR | Status: DC | PRN
  Administered 2014-03-01: 100 mg via INTRAVENOUS

## 2014-03-01 MED ORDER — ACETAMINOPHEN 500 MG PO TABS: 1000.0000 mg | ORAL_TABLET | Freq: Three times a day (TID) | ORAL | Status: DC

## 2014-03-01 MED ORDER — PHENOL 1.4 % MT LIQD: 2.0000 | OROMUCOSAL | Status: DC | PRN

## 2014-03-01 MED ORDER — MIDAZOLAM HCL 5 MG/5ML IJ SOLN
INTRAMUSCULAR | Status: DC | PRN
  Administered 2014-03-01 (×2): 1 mg via INTRAVENOUS

## 2014-03-01 MED ORDER — ONDANSETRON HCL 4 MG/2ML IJ SOLN
INTRAMUSCULAR | Status: AC
  Filled 2014-03-01: qty 2

## 2014-03-01 MED ORDER — METOPROLOL TARTRATE 1 MG/ML IV SOLN
5.0000 mg | Freq: Four times a day (QID) | INTRAVENOUS | Status: DC | PRN
  Filled 2014-03-01: qty 5

## 2014-03-01 MED ORDER — DEXAMETHASONE SODIUM PHOSPHATE 10 MG/ML IJ SOLN
INTRAMUSCULAR | Status: DC | PRN
  Administered 2014-03-01: 10 mg via INTRAVENOUS

## 2014-03-01 MED ORDER — ACETAMINOPHEN 325 MG PO TABS: 650.0000 mg | ORAL_TABLET | Freq: Four times a day (QID) | ORAL | Status: DC | PRN

## 2014-03-01 MED ORDER — PROPOFOL 10 MG/ML IV BOLUS
INTRAVENOUS | Status: AC
  Filled 2014-03-01: qty 40

## 2014-03-01 MED ORDER — ROCURONIUM BROMIDE 100 MG/10ML IV SOLN
INTRAVENOUS | Status: DC | PRN
  Administered 2014-03-01: 5 mg via INTRAVENOUS
  Administered 2014-03-01: 10 mg via INTRAVENOUS
  Administered 2014-03-01: 20 mg via INTRAVENOUS

## 2014-03-01 MED ORDER — MENTHOL 3 MG MT LOZG: 1.0000 | LOZENGE | OROMUCOSAL | Status: DC | PRN

## 2014-03-01 MED ORDER — PROMETHAZINE HCL 25 MG/ML IJ SOLN
6.2500 mg | INTRAMUSCULAR | Status: DC | PRN
  Filled 2014-03-01: qty 1

## 2014-03-01 MED ORDER — INFLUENZA VAC SPLIT QUAD 0.5 ML IM SUSY: 0.5000 mL | PREFILLED_SYRINGE | INTRAMUSCULAR | Status: DC | PRN

## 2014-03-01 MED ORDER — LACTATED RINGERS IV BOLUS (SEPSIS): 1000.0000 mL | Freq: Three times a day (TID) | INTRAVENOUS | Status: DC | PRN

## 2014-03-01 MED ORDER — GLYCOPYRROLATE 0.2 MG/ML IJ SOLN
INTRAMUSCULAR | Status: DC | PRN
  Administered 2014-03-01: 0.6 mg via INTRAVENOUS

## 2014-03-01 MED ORDER — FENTANYL CITRATE 0.05 MG/ML IJ SOLN
INTRAMUSCULAR | Status: DC | PRN
  Administered 2014-03-01: 50 ug via INTRAVENOUS
  Administered 2014-03-01: 100 ug via INTRAVENOUS
  Administered 2014-03-01 (×2): 50 ug via INTRAVENOUS

## 2014-03-01 MED ORDER — VITAMIN C 500 MG/5ML PO SYRP: 500.0000 mg | ORAL_SOLUTION | Freq: Two times a day (BID) | ORAL | Status: DC

## 2014-03-01 MED ORDER — FENTANYL CITRATE 0.05 MG/ML IJ SOLN
INTRAMUSCULAR | Status: AC
  Filled 2014-03-01: qty 5

## 2014-03-01 MED ORDER — LIDOCAINE HCL (CARDIAC) 20 MG/ML IV SOLN
INTRAVENOUS | Status: AC
  Filled 2014-03-01: qty 5

## 2014-03-01 MED ORDER — BUPIVACAINE-EPINEPHRINE 0.25% -1:200000 IJ SOLN
INTRAMUSCULAR | Status: AC
  Filled 2014-03-01: qty 1

## 2014-03-01 MED ORDER — DIPHENHYDRAMINE HCL 50 MG/ML IJ SOLN
INTRAMUSCULAR | Status: DC | PRN
  Administered 2014-03-01: 25 mg via INTRAVENOUS

## 2014-03-01 MED ORDER — ACETAMINOPHEN 650 MG RE SUPP: 650.0000 mg | Freq: Four times a day (QID) | RECTAL | Status: DC | PRN

## 2014-03-01 MED ORDER — HYDROCODONE-ACETAMINOPHEN 5-325 MG PO TABS: 1.0000 | ORAL_TABLET | ORAL | Status: DC | PRN

## 2014-03-01 MED ORDER — MAGIC MOUTHWASH
15.0000 mL | Freq: Four times a day (QID) | ORAL | Status: DC | PRN
  Filled 2014-03-01: qty 15

## 2014-03-01 MED ORDER — BISACODYL 10 MG RE SUPP: 10.0000 mg | Freq: Two times a day (BID) | RECTAL | Status: DC | PRN

## 2014-03-01 MED ORDER — KCL IN DEXTROSE-NACL 40-5-0.45 MEQ/L-%-% IV SOLN
INTRAVENOUS | Status: DC
  Administered 2014-03-01: 13:00:00 via INTRAVENOUS
  Filled 2014-03-01: qty 1000

## 2014-03-01 MED ORDER — NEOSTIGMINE METHYLSULFATE 10 MG/10ML IV SOLN
INTRAVENOUS | Status: DC | PRN
  Administered 2014-03-01: 4 mg via INTRAVENOUS

## 2014-03-01 MED ORDER — ACETAMINOPHEN 325 MG PO TABS
325.0000 mg | ORAL_TABLET | Freq: Four times a day (QID) | ORAL | Status: DC | PRN
Start: 2014-03-01 — End: 2014-03-01

## 2014-03-01 MED ORDER — METOPROLOL TARTRATE 12.5 MG HALF TABLET
12.5000 mg | ORAL_TABLET | Freq: Two times a day (BID) | ORAL | Status: DC | PRN
  Filled 2014-03-01: qty 1

## 2014-03-01 MED ORDER — LACTATED RINGERS IV SOLN
INTRAVENOUS | Status: DC | PRN
  Administered 2014-03-01: 09:00:00 via INTRAVENOUS

## 2014-03-01 SURGICAL SUPPLY — 38 items
APPLIER CLIP 5 13 M/L LIGAMAX5 (MISCELLANEOUS)
APPLIER CLIP ROT 10 11.4 M/L (STAPLE) ×2
CANISTER SUCTION 2500CC (MISCELLANEOUS) ×2 IMPLANT
CHLORAPREP W/TINT 26ML (MISCELLANEOUS) ×2 IMPLANT
CLIP APPLIE 5 13 M/L LIGAMAX5 (MISCELLANEOUS) IMPLANT
CLIP APPLIE ROT 10 11.4 M/L (STAPLE) ×1 IMPLANT
CUTTER FLEX LINEAR 45M (STAPLE) IMPLANT
DECANTER SPIKE VIAL GLASS SM (MISCELLANEOUS) ×2 IMPLANT
DERMABOND ADVANCED (GAUZE/BANDAGES/DRESSINGS)
DERMABOND ADVANCED .7 DNX12 (GAUZE/BANDAGES/DRESSINGS) IMPLANT
DRAPE LAPAROSCOPIC ABDOMINAL (DRAPES) ×2 IMPLANT
ELECT REM PT RETURN 9FT ADLT (ELECTROSURGICAL) ×2
ELECTRODE REM PT RTRN 9FT ADLT (ELECTROSURGICAL) ×1 IMPLANT
GLOVE BIOGEL PI IND STRL 7.0 (GLOVE) ×1 IMPLANT
GLOVE BIOGEL PI INDICATOR 7.0 (GLOVE) ×1
GLOVE SS BIOGEL STRL SZ 7.5 (GLOVE) ×1 IMPLANT
GLOVE SUPERSENSE BIOGEL SZ 7.5 (GLOVE) ×1
GOWN STRL REUS W/TWL LRG LVL3 (GOWN DISPOSABLE) ×2 IMPLANT
GOWN STRL REUS W/TWL XL LVL3 (GOWN DISPOSABLE) ×4 IMPLANT
IV LACTATED RINGERS 1000ML (IV SOLUTION) ×2 IMPLANT
KIT BASIN OR (CUSTOM PROCEDURE TRAY) ×2 IMPLANT
NS IRRIG 1000ML POUR BTL (IV SOLUTION) ×2 IMPLANT
PENCIL BUTTON HOLSTER BLD 10FT (ELECTRODE) ×2 IMPLANT
POUCH SPECIMEN RETRIEVAL 10MM (ENDOMECHANICALS) ×2 IMPLANT
RELOAD 45 VASCULAR/THIN (ENDOMECHANICALS) IMPLANT
RELOAD STAPLE TA45 3.5 REG BLU (ENDOMECHANICALS) IMPLANT
SET IRRIG TUBING LAPAROSCOPIC (IRRIGATION / IRRIGATOR) ×2 IMPLANT
SHEARS HARMONIC ACE PLUS 36CM (ENDOMECHANICALS) ×2 IMPLANT
SOLUTION ANTI FOG 6CC (MISCELLANEOUS) ×2 IMPLANT
STRIP CLOSURE SKIN 1/2X4 (GAUZE/BANDAGES/DRESSINGS) ×2 IMPLANT
SUT MNCRL AB 4-0 PS2 18 (SUTURE) ×2 IMPLANT
TOWEL OR 17X26 10 PK STRL BLUE (TOWEL DISPOSABLE) ×2 IMPLANT
TRAY FOLEY CATH 14FRSI W/METER (CATHETERS) ×2 IMPLANT
TRAY LAPAROSCOPIC (CUSTOM PROCEDURE TRAY) ×2 IMPLANT
TROCAR BLADELESS OPT 5 75 (ENDOMECHANICALS) ×2 IMPLANT
TROCAR XCEL 12X100 BLDLESS (ENDOMECHANICALS) ×2 IMPLANT
TROCAR XCEL BLUNT TIP 100MML (ENDOMECHANICALS) ×2 IMPLANT
TUBING INSUFFLATION 10FT LAP (TUBING) ×2 IMPLANT

## 2014-03-01 NOTE — Progress Notes (Signed)
Discussed discharge information with patient. Patient states she understands and has no questions.

## 2014-03-01 NOTE — H&P (Signed)
Amy Golden  [address] [place], [place] [address] Phone: [phone] FAX: [phone]     Amy Golden  1980-09-12 [id number]  CARE TEAM:  PCP: No primary provider on file.  Outpatient Care Team: Patient has no care team.  Inpatient Treatment Team: Treatment Team: Attending Provider: Md Edison Pace, MD; Physician Assistant: Jamse Mead, PA-C; Registered Nurse: Darcus Austin, RN; Technician: Romelle Starcher, EMT; Consulting Physician: Nolon Nations, MD  This patient is a 33 y.o.female who presents today for surgical evaluation at the request of Jamse Mead PA & Dr Delora Fuel, [facility] ED.   Reason for evaluation: Appendicitis  Pleasant young healthy female who began abdominal pain 3 days ago.  Mainly vague but became more focal in the right lower side.  Denies any sick contacts.  No travel history.  Has had mild abdominal pains in the past but nothing like this.  Pain become far more intense today.  Went to urgent care.  They were concerned.  I recommended evaluation emergency room.  History physical and CT scan and turning concerning for acute appendicitis.  Surgical consultation requested.  Patient notes pain is much more intense in the right lower side.  Entire abdomen uncomfortable.  Hurts to cough.  Smokes cigarettes a few times a week.  No history of jaundice or pancreatitis or hepatitis.  Mild history of reflux in the past.  Briefly used proton pump inhibitors.  That was years ago.  No problems now.  No personal nor family history of GI/colon cancer, inflammatory bowel disease, irritable bowel syndrome, allergy such as Celiac Sprue, dietary/dairy problems, colitis, ulcers nor gastritis.  No recent sick contacts/gastroenteritis.  No travel outside the country.  No changes in diet.  No dysphagia to solids or liquids.  No significant heartburn or reflux.  No hematochezia, hematemesis, coffee ground emesis.  No evidence of prior gastric/peptic  ulceration.    History reviewed. No pertinent past medical history.  Past Surgical History  Procedure Laterality Date  . Wrist surgery      History   Social History  . Marital Status: Single    Spouse Name: N/A    Number of Children: N/A  . Years of Education: N/A   Occupational History  . Not on file.   Social History Main Topics  . Smoking status: Current Some Day Smoker    Types: Cigarettes  . Smokeless tobacco: Not on file  . Alcohol Use: Yes     Comment: occasional  . Drug Use: No  . Sexual Activity: Not on file   Other Topics Concern  . Not on file   Social History Narrative  . No narrative on file    History reviewed. No pertinent family history.  Current Facility-Administered Medications  Medication Dose Route Frequency Provider Last Rate Last Dose  . acetaminophen (TYLENOL) tablet 650 mg  650 mg Oral Q6H PRN Michael Boston, MD       Or  . acetaminophen (TYLENOL) suppository 650 mg  650 mg Rectal Q6H PRN Michael Boston, MD      . alum & mag hydroxide-simeth (MAALOX/MYLANTA) 200-200-20 MG/5ML suspension 30 mL  30 mL Oral Q6H PRN Michael Boston, MD      . ascorbic acid (VITAMIN C) 500 MG/5ML syrup 500 mg  500 mg Oral BID Michael Boston, MD      . bisacodyl (DULCOLAX) suppository 10 mg  10 mg Rectal Q12H PRN Michael Boston, MD      . chlorhexidine (  HIBICLENS) 4 % liquid 1 application  1 application Topical Once Michael Boston, MD      . Derrill Memo ON 03/02/2014] chlorhexidine (HIBICLENS) 4 % liquid 1 application  1 application Topical Once Michael Boston, MD      . ciprofloxacin (CIPRO) IVPB 400 mg  400 mg Intravenous Q12H Michael Boston, MD      . dextrose 5 % and 0.45 % NaCl with KCl 40 mEq/L infusion   Intravenous Continuous Michael Boston, MD      . diphenhydrAMINE (BENADRYL) injection 12.5-25 mg  12.5-25 mg Intravenous Q6H PRN Michael Boston, MD       Or  . diphenhydrAMINE (BENADRYL) 12.5 MG/5ML elixir 12.5-25 mg  12.5-25 mg Oral Q6H PRN Michael Boston, MD      . Derrill Memo ON  03/02/2014] heparin injection 5,000 Units  5,000 Units Subcutaneous 3 times per day Michael Boston, MD      . HYDROmorphone (DILAUDID) injection 0.5-2 mg  0.5-2 mg Intravenous Q2H PRN Michael Boston, MD      . lactated ringers bolus 1,000 mL  1,000 mL Intravenous Once Michael Boston, MD      . lactated ringers bolus 1,000 mL  1,000 mL Intravenous Q8H PRN Michael Boston, MD      . lip balm (CARMEX) ointment 1 application  1 application Topical BID Michael Boston, MD      . loratadine (CLARITIN) tablet 10 mg  10 mg Oral Daily Michael Boston, MD      . magic mouthwash  15 mL Oral QID PRN Michael Boston, MD      . menthol-cetylpyridinium (CEPACOL) lozenge 3 mg  1 lozenge Oral PRN Michael Boston, MD      . metoprolol (LOPRESSOR) injection 5 mg  5 mg Intravenous Q6H PRN Michael Boston, MD      . metoprolol tartrate (LOPRESSOR) tablet 12.5 mg  12.5 mg Oral Q12H PRN Michael Boston, MD      . metroNIDAZOLE (FLAGYL) IVPB 500 mg  500 mg Intravenous Q6H Michael Boston, MD      . ondansetron [facility]) injection 4 mg  4 mg Intravenous Q6H PRN Michael Boston, MD       Or  . ondansetron (ZOFRAN) 8 mg/NS 50 ml IVPB  8 mg Intravenous Q6H PRN Michael Boston, MD      . oxyCODONE (Oxy IR/ROXICODONE) immediate release tablet 5-10 mg  5-10 mg Oral Q4H PRN Michael Boston, MD      . phenol (CHLORASEPTIC) mouth spray 2 spray  2 spray Mouth/Throat PRN Michael Boston, MD      . promethazine (PHENERGAN) injection 6.25-12.5 mg  6.25-12.5 mg Intravenous Q4H PRN Michael Boston, MD      . triamcinolone (NASACORT) nasal inhaler 2 spray  2 spray Each Nare Daily Michael Boston, MD      . zolpidem (AMBIEN) tablet 5-10 mg  5-10 mg Oral QHS PRN Michael Boston, MD       Current Outpatient Prescriptions  Medication Sig Dispense Refill  . Ascorbic Acid (VITAMIN C PO) Take 1,000 mg by mouth daily.      Marland Kitchen b complex vitamins tablet Take 1 tablet by mouth daily.      Marland Kitchen BIOTIN PO Take 10,000 mg by mouth daily.      . fexofenadine (ALLEGRA) 180 MG tablet Take 180 mg by mouth  daily.      Marland Kitchen MAGNESIUM PO Take 1 tablet by mouth daily.      Marland Kitchen triamcinolone (NASACORT) 55 MCG/ACT AERO nasal inhaler Place 2 sprays  into both nostrils daily.      . Vitamins A & D (VITAMIN A & D) [address] UNITS TABS Take 1 tablet by mouth daily.         Allergies  Allergen Reactions  . Codeine Hives  . Penicillins Hives and Swelling  . Wellbutrin [Bupropion] Other (See Comments)    Swelling Chest  . Amoxicillin Itching and Rash    ROS: Constitutional:  No fevers, chills, sweats.  Weight stable Eyes:  No vision changes, No discharge HENT:  No sore throats, nasal drainage Lymph: No neck swelling, No bruising easily Pulmonary:  No cough, productive sputum CV: No orthopnea, PND  Patient walks 60 minutes for about 2 miles without difficulty.  No exertional chest/neck/shoulder/arm pain. GI:  No personal nor family history of GI/colon cancer, inflammatory bowel disease, irritable bowel syndrome, allergy such as Celiac Sprue, dietary/dairy problems, colitis, ulcers nor gastritis.  No recent sick contacts/gastroenteritis.  No travel outside the country.  No changes in diet. Renal: No UTIs, No hematuria Genital:  No drainage, bleeding, masses Musculoskeletal: No severe joint pain.  Good ROM major joints Skin:  No sores or lesions.  No rashes Heme/Lymph:  No easy bleeding.  No swollen lymph nodes Neuro: No focal weakness/numbness.  No seizures Psych: No suicidal ideation.  No hallucinations  BP 128/77  Pulse 87  Temp(Src) 98.2 F (36.8 C) (Oral)  Resp 15  SpO2 100%  LMP 02/10/2014  Physical Exam: General: Pt awake/alert/oriented x4 in no major acute distress Eyes: PERRL, normal EOM. Sclera nonicteric Neuro: CN II-XII intact w/o focal sensory/motor deficits. Lymph: No head/neck/groin lymphadenopathy Psych:  No delerium/psychosis/paranoia.  Mildly anxious and apologetic but consolable HENT: Normocephalic, Mucus membranes moist.  No thrush Neck: Supple, No tracheal  deviation Chest: No pain.  Good respiratory excursion. CV:  Pulses intact.  Regular rhythm Abdomen: Soft, Nondistended.  No incarcerated hernias.  Tender in right lower quadrant with involuntary guarding.  Reproduction of pain with bed shaken cough.  Positive Rovsing sign Ext:  SCDs BLE.  No significant edema.  No cyanosis Skin: No petechiae / purpurea.  No major sores Musculoskeletal: No severe joint pain.  Good ROM major joints   Results:   Labs: Results for orders placed during the hospital encounter of 02/28/14 (from the past 48 hour(s))  URINALYSIS, ROUTINE W REFLEX MICROSCOPIC     Status: Abnormal   Collection Time    02/28/14  9:44 PM      Result Value Ref Range   Color, Urine YELLOW  YELLOW   APPearance CLOUDY (*) CLEAR   Specific Gravity, Urine 1.015  1.005 - 1.030   pH 5.5  5.0 - 8.0   Glucose, UA NEGATIVE  NEGATIVE mg/dL   Hgb urine dipstick NEGATIVE  NEGATIVE   Bilirubin Urine NEGATIVE  NEGATIVE   Ketones, ur NEGATIVE  NEGATIVE mg/dL   Protein, ur NEGATIVE  NEGATIVE mg/dL   Urobilinogen, UA 0.2  0.0 - 1.0 mg/dL   Nitrite NEGATIVE  NEGATIVE   Leukocytes, UA NEGATIVE  NEGATIVE   Comment: MICROSCOPIC NOT DONE ON URINES WITH NEGATIVE PROTEIN, BLOOD, LEUKOCYTES, NITRITE, OR GLUCOSE <1000 mg/dL.  POC URINE PREG, ED     Status: None   Collection Time    02/28/14  9:55 PM      Result Value Ref Range   Preg Test, Ur NEGATIVE  NEGATIVE   Comment:            THE SENSITIVITY OF THIS     METHODOLOGY IS >  24 mIU/mL  CBC WITH DIFFERENTIAL     Status: Abnormal   Collection Time    02/28/14 10:27 PM      Result Value Ref Range   WBC 10.8 (*) 4.0 - 10.5 K/uL   RBC 4.47  3.87 - 5.11 MIL/uL   Hemoglobin 13.6  12.0 - 15.0 g/dL   HCT 39.6  36.0 - 46.0 %   MCV 88.6  78.0 - 100.0 fL   MCH 30.4  26.0 - 34.0 pg   MCHC 34.3  30.0 - 36.0 g/dL   RDW 12.7  11.5 - 15.5 %   Platelets 233  150 - 400 K/uL   Neutrophils Relative % 69  43 - 77 %   Neutro Abs 7.4  1.7 - 7.7 K/uL    Lymphocytes Relative 24  12 - 46 %   Lymphs Abs 2.6  0.7 - 4.0 K/uL   Monocytes Relative 6  3 - 12 %   Monocytes Absolute 0.7  0.1 - 1.0 K/uL   Eosinophils Relative 1  0 - 5 %   Eosinophils Absolute 0.1  0.0 - 0.7 K/uL   Basophils Relative 0  0 - 1 %   Basophils Absolute 0.0  0.0 - 0.1 K/uL  COMPREHENSIVE METABOLIC PANEL     Status: None   Collection Time    02/28/14 10:27 PM      Result Value Ref Range   Sodium 140  137 - 147 mEq/L   Potassium 4.2  3.7 - 5.3 mEq/L   Chloride 103  96 - 112 mEq/L   CO2 25  19 - 32 mEq/L   Glucose, Bld 93  70 - 99 mg/dL   BUN 12  6 - 23 mg/dL   Creatinine, Ser 0.63  0.50 - 1.10 mg/dL   Calcium 9.3  8.4 - 10.5 mg/dL   Total Protein 7.0  6.0 - 8.3 g/dL   Albumin 4.0  3.5 - 5.2 g/dL   AST 17  0 - 37 U/L   ALT 14  0 - 35 U/L   Alkaline Phosphatase 40  39 - 117 U/L   Total Bilirubin 0.7  0.3 - 1.2 mg/dL   GFR calc non Af Amer >90  >90 mL/min   GFR calc Af Amer >90  >90 mL/min   Comment: (NOTE)     The eGFR has been calculated using the CKD EPI equation.     This calculation has not been validated in all clinical situations.     eGFR's persistently <90 mL/min signify possible Chronic Kidney     Disease.   Anion gap 12  5 - 15  LIPASE, BLOOD     Status: None   Collection Time    02/28/14 10:27 PM      Result Value Ref Range   Lipase 33  11 - 59 U/L    Imaging / Studies: Ct Abdomen Pelvis W Contrast  03/01/2014   CLINICAL DATA:  Right lower quadrant pain for 3 days. White cell count 10.8.  EXAM: CT ABDOMEN AND PELVIS WITH CONTRAST  TECHNIQUE: Multidetector CT imaging of the abdomen and pelvis was performed using the standard protocol following bolus administration of intravenous contrast.  CONTRAST:  69mL OMNIPAQUE IOHEXOL 300 MG/ML  SOLN  COMPARISON:  None.  FINDINGS: Mild dependent changes in the lung bases. Multiple small scattered low-attenuation lesions in the liver. The largest is in segment 7, measuring 17 mm maximal diameter. The largest  lesion demonstrates peripheral nodular contrast filling  suggesting hemangioma. Additional lesions are too small to characterize and could represent cysts or hemangiomas. The gallbladder, spleen, pancreas, adrenal glands, kidneys, abdominal aorta, inferior vena cava, and retroperitoneal lymph nodes are unremarkable. Stomach is decompressed. Small bowel is not abnormally distended. Stool-filled colon without distention. No free air or free fluid in the abdomen.  Pelvis: The appendix extends medially from the cecal tip and is mildly distended and fluid filled with periappendiceal fat stranding. Changes are consistent with early acute appendicitis. No abscess. Small amount of free fluid in the pelvis may be reactive or physiologic. Uterus and ovaries are not enlarged. Bladder wall is not thickened. No evidence of diverticulitis. No destructive bone lesions.  IMPRESSION: Mild distention of fluid-filled appendix with periappendiceal stranding consistent with early appendicitis. No abscess. Small amount of free fluid in the pelvis may be physiologic or reactive.   Electronically Signed   By: Lucienne Capers M.D.   On: 03/01/2014 01:12    Medications / Allergies: per chart  Antibiotics: Anti-infectives   Start     Dose/Rate Route Frequency Ordered Stop   03/01/14 0230  metroNIDAZOLE (FLAGYL) IVPB 500 mg     500 mg 100 mL/hr over 60 Minutes Intravenous Every 6 hours 03/01/14 0225     03/01/14 0230  ciprofloxacin (CIPRO) IVPB 400 mg     400 mg 200 mL/hr over 60 Minutes Intravenous Every 12 hours 03/01/14 0225        Assessment  Mel Almond  33 y.o. female       Problem List:  Principal Problem:   Acute appendicitis   Acute appendicitis.  Plan:  Admit.  IV antibiotics.  Do Cipro/Flagyl given penicillin allergy.  May adjust per protocol.  Nausea & pain control  Diagnostic laparoscopy with appendectomy:  The anatomy & physiology of the digestive tract was discussed.  The pathophysiology of  appendicitis and other appendiceal disorders were discussed.  Natural history risks without surgery was discussed.   I feel the risks of no intervention will lead to serious problems that outweigh the operative risks; therefore, I recommended diagnostic laparoscopy with removal of appendix to remove the pathology.  Laparoscopic & open techniques were discussed.   I noted a good likelihood this will help address the problem.   Risks such as bleeding, infection, abscess, leak, reoperation, possible ostomy, hernia, heart attack, stroke, death, and other risks were discussed.  Goals of post-operative recovery were discussed as well.  We will work to minimize complications.  Questions were answered.  The patient expresses understanding & wishes to proceed with surgery.   -VTE prophylaxis- SCDs, etc -mobilize as tolerated to help recovery    Adin Hector, M.D., F.A.C.S. Gastrointestinal and Minimally Invasive Surgery [facility] [address], [place], [place] [address] [phone] Main / Paging   03/01/2014  Note: Portions of this report may have been transcribed using voice recognition software. Every effort was made to ensure accuracy; however, inadvertent computerized transcription errors may be present.   Any transcriptional errors that result from this process are unintentional.

## 2014-03-01 NOTE — Discharge Instructions (Signed)
°LAPAROSCOPIC SURGERY: POST OP INSTRUCTIONS  °1. DIET: Follow a light bland diet the first 24 hours after arrival home, such as soup, liquids, crackers, etc. Be sure to include lots of fluids daily. Avoid fast food or heavy meals as your are more likely to get nauseated. Eat a low fat the next few days after surgery.  °2. Take your usually prescribed home medications unless otherwise directed. °3. PAIN CONTROL:  °a. Pain is best controlled by a usual combination of three different methods TOGETHER:  °i. Ice/Heat °ii. Over the counter pain medication °iii. Prescription pain medication °b. Most patients will experience some swelling and bruising around the incisions. Ice packs or heating pads (30-60 minutes up to 6 times a day) will help. Use ice for the first few days to help decrease swelling and bruising, then switch to heat to help relax tight/sore spots and speed recovery. Some people prefer to use ice alone, heat alone, alternating between ice & heat. Experiment to what works for you. Swelling and bruising can take several weeks to resolve.  °c. It is helpful to take an over-the-counter pain medication regularly for the first few weeks. Choose one of the following that works best for you:  °i. Naproxen (Aleve, etc) Two 220mg tabs twice a day °ii. Ibuprofen (Advil, etc) Three 200mg tabs four times a day (every meal & bedtime) °iii. Acetaminophen (Tylenol, etc) 500-650mg four times a day (every meal & bedtime) °d. A prescription for pain medication (such as oxycodone, hydrocodone, etc) should be given to you upon discharge. Take your pain medication as prescribed.  °i. If you are having problems/concerns with the prescription medicine (does not control pain, nausea, vomiting, rash, itching, etc), please call us [phone] to see if we need to switch you to a different pain medicine that will work better for you and/or control your side effect better. °ii. If you need a refill on your pain medication, please  contact your pharmacy. They will contact our office to request authorization. Prescriptions will not be filled after 5 pm or on week-ends. °4. Avoid getting constipated. Between the surgery and the pain medications, it is common to experience some constipation. Increasing fluid intake and taking a fiber supplement (such as Metamucil, Citrucel, FiberCon, MiraLax, etc) 1-2 times a day regularly will usually help prevent this problem from occurring. A mild laxative (prune juice, Milk of Magnesia, MiraLax, etc) should be taken according to package directions if there are no bowel movements after 48 hours.  °5. Watch out for diarrhea. If you have many loose bowel movements, simplify your diet to bland foods & liquids for a few days. Stop any stool softeners and decrease your fiber supplement. Switching to mild anti-diarrheal medications (Kayopectate, Pepto Bismol) can help. If this worsens or does not improve, please call us. °6. Wash / shower every day. You may shower over the dressings as they are waterproof. Continue to shower over incision(s) after the dressing is off. °7. Remove your waterproof bandages 5 days after surgery. You may leave the incision open to air. You may replace a dressing/Band-Aid to cover the incision for comfort if you wish.  °8. ACTIVITIES as tolerated:  °a. You may resume regular (light) daily activities beginning the next day--such as daily self-care, walking, climbing stairs--gradually increasing activities as tolerated. If you can walk 30 minutes without difficulty, it is safe to try more intense activity such as jogging, treadmill, bicycling, low-impact aerobics, swimming, etc. °b. Save the most intensive and strenuous activity for   last such as sit-ups, heavy lifting, contact sports, etc Refrain from any heavy lifting or straining until you are off narcotics for pain control.  °c. DO NOT PUSH THROUGH PAIN. Let pain be your guide: If it hurts to do something, don't do it. Pain is your body  warning you to avoid that activity for another week until the pain goes down. °d. You may drive when you are no longer taking prescription pain medication, you can comfortably wear a seatbelt, and you can safely maneuver your car and apply brakes. °e. You may have sexual intercourse when it is comfortable.  °9. FOLLOW UP in our office  °a. Please call [facility] at [phone] to set up an appointment to see your surgeon in the office for a follow-up appointment approximately 2-3 weeks after your surgery. °b. Make sure that you call for this appointment the day you arrive home to insure a convenient appointment time. °     10. IF YOU HAVE DISABILITY OR FAMILY LEAVE FORMS, BRING THEM TO THE               OFFICE FOR PROCESSING.  ° °WHEN TO CALL US [phone]:  °1. Poor pain control °2. Reactions / problems with new medications (rash/itching, nausea, etc)  °3. Fever over 101.5 F (38.5 C) °4. Inability to urinate °5. Nausea and/or vomiting °6. Worsening swelling or bruising °7. Continued bleeding from incision. °8. Increased pain, redness, or drainage from the incision ° °The clinic staff is available to answer your questions during regular business hours (8:30am-5pm). Please don’t hesitate to call and ask to speak to one of our nurses for clinical concerns.  °If you have a medical emergency, go to the nearest emergency room or call 911.  °A surgeon from [facility] is always on call at the hospitals  ° °[facility]  °[address], [address], [place], [place] [address] ?  °MAIN: [phone] ? TOLL FREE: [phone] ?  °FAX [phone]  °[email or web address] ° °

## 2014-03-01 NOTE — Progress Notes (Signed)
UR completed 

## 2014-03-01 NOTE — Progress Notes (Signed)
Patient ID: Amy LimingMarie Golden, female   DOB: 09-05-1980, 33 y.o.   MRN: [id number]   I have reviewed imaging, interviewed and examined patient.  Plan to proceed with appendectomy. Discussed risks of surgery including anesthesia complications, bleeding, infection, and she agrees to proceed

## 2014-03-01 NOTE — Anesthesia Preprocedure Evaluation (Addendum)
Anesthesia Evaluation  Patient identified by MRN, date of birth, ID band Patient awake    Reviewed: Allergy & Precautions, H&P , NPO status , Patient's Chart, lab work & pertinent test results  Airway Mallampati: II TM Distance: >3 FB Neck ROM: full    Dental no notable dental hx. (+) Teeth Intact, Dental Advisory Given   Pulmonary neg pulmonary ROS, Current Smoker,  breath sounds clear to auscultation  Pulmonary exam normal       Cardiovascular Exercise Tolerance: Good negative cardio ROS  Rhythm:regular Rate:Normal     Neuro/Psych negative neurological ROS  negative psych ROS   GI/Hepatic negative GI ROS, Neg liver ROS,   Endo/Other  negative endocrine ROS  Renal/GU negative Renal ROS  negative genitourinary   Musculoskeletal   Abdominal   Peds  Hematology negative hematology ROS (+)   Anesthesia Other Findings   Reproductive/Obstetrics negative OB ROS                          Anesthesia Physical Anesthesia Plan  ASA: II  Anesthesia Plan: General   Post-op Pain Management:    Induction: Intravenous, Rapid sequence and Cricoid pressure planned  Airway Management Planned: Oral ETT  Additional Equipment:   Intra-op Plan:   Post-operative Plan: Extubation in OR  Informed Consent: I have reviewed the patients History and Physical, chart, labs and discussed the procedure including the risks, benefits and alternatives for the proposed anesthesia with the patient or authorized representative who has indicated his/her understanding and acceptance.   Dental Advisory Given  Plan Discussed with: CRNA and Surgeon  Anesthesia Plan Comments:         Anesthesia Quick Evaluation

## 2014-03-01 NOTE — Discharge Summary (Signed)
[facility] Discharge Summary   Patient ID: Amy LimingMarie Golden MRN: [id number] DOB/AGE: 09-29-1980 33 y.o.  Admit date: 02/28/2014 Discharge date: 03/01/2014  Admitting Diagnosis: Acute appendicitis  Discharge Diagnosis Patient Active Problem List   Diagnosis Date Noted  . Acute appendicitis 03/01/2014    Consultants None  Imaging: Ct Abdomen Pelvis W Contrast  03/01/2014   CLINICAL DATA:  Right lower quadrant pain for 3 days. White cell count 10.8.  EXAM: CT ABDOMEN AND PELVIS WITH CONTRAST  TECHNIQUE: Multidetector CT imaging of the abdomen and pelvis was performed using the standard protocol following bolus administration of intravenous contrast.  CONTRAST:  50mL OMNIPAQUE IOHEXOL 300 MG/ML  SOLN  COMPARISON:  None.  FINDINGS: Mild dependent changes in the lung bases. Multiple small scattered low-attenuation lesions in the liver. The largest is in segment 7, measuring 17 mm maximal diameter. The largest lesion demonstrates peripheral nodular contrast filling suggesting hemangioma. Additional lesions are too small to characterize and could represent cysts or hemangiomas. The gallbladder, spleen, pancreas, adrenal glands, kidneys, abdominal aorta, inferior vena cava, and retroperitoneal lymph nodes are unremarkable. Stomach is decompressed. Small bowel is not abnormally distended. Stool-filled colon without distention. No free air or free fluid in the abdomen.  Pelvis: The appendix extends medially from the cecal tip and is mildly distended and fluid filled with periappendiceal fat stranding. Changes are consistent with early acute appendicitis. No abscess. Small amount of free fluid in the pelvis may be reactive or physiologic. Uterus and ovaries are not enlarged. Bladder wall is not thickened. No evidence of diverticulitis. No destructive bone lesions.  IMPRESSION: Mild distention of fluid-filled appendix with periappendiceal stranding consistent with early appendicitis. No  abscess. Small amount of free fluid in the pelvis may be physiologic or reactive.   Electronically Signed   By: Burman NievesWilliam  Stevens M.D.   On: 03/01/2014 01:12    Procedures Dr. Johna SheriffHoxworth (03/01/14) - Laparoscopic Appendectomy  Hospital Course:  Pleasant young healthy 33 y/o white female who began abdominal pain 3 days ago. Mainly vague but became more focal in the right lower side. Denies any sick contacts. No travel history. Has had mild abdominal pains in the past but nothing like this. Pain become far more intense today. Went to urgent care. They were concerned. I recommended evaluation emergency room. History physical and CT scan and turning concerning for acute appendicitis. Surgical consultation requested.   Patient notes pain is much more intense in the right lower side. Entire abdomen uncomfortable. Hurts to cough. Smokes cigarettes a few times a week. No history of jaundice or pancreatitis or hepatitis. Mild history of reflux in the past. Briefly used proton pump inhibitors. That was years ago. No problems now. No personal nor family history of GI/colon cancer, inflammatory bowel disease, irritable bowel syndrome, allergy such as Celiac Sprue, dietary/dairy problems, colitis, ulcers nor gastritis. No recent sick contacts/gastroenteritis. No travel outside the country. No changes in diet. No dysphagia to solids or liquids. No significant heartburn or reflux. No hematochezia, hematemesis, coffee ground emesis. No evidence of prior gastric/peptic ulceration.  Patient was admitted and underwent procedure listed above.  Tolerated procedure well and was transferred to the floor.  Diet was advanced as tolerated.  On POD #0, the patient was voiding well, tolerating diet, ambulating well, pain well controlled, vital signs stable, incisions c/d/i and felt stable for discharge home.  Patient will follow up in our office in 3 weeks and knows to call with questions or concerns.  The patient will be  discharged  home with EITHER oxycodone OR ultram depending on tolerance.  She can take these with benadryl as needed, since she does get some itching with hydrocodone.  Physical Exam: General:  Alert, NAD, pleasant, comfortable Abd:  Soft, ND, mild tenderness, incisions C/D/I     Medication List         acetaminophen 325 MG tablet  Commonly known as:  TYLENOL  Take 2 tablets (650 mg total) by mouth every 6 (six) hours as needed for mild pain (or Temp > 100).     b complex vitamins tablet  Take 1 tablet by mouth daily.     BIOTIN PO  Take 10,000 mg by mouth daily.     docusate sodium 100 MG capsule  Commonly known as:  COLACE  Take 1 capsule (100 mg total) by mouth 2 (two) times daily as needed for mild constipation or moderate constipation.     fexofenadine 180 MG tablet  Commonly known as:  ALLEGRA  Take 180 mg by mouth daily.     MAGNESIUM PO  Take 1 tablet by mouth daily.     oxyCODONE 5 MG immediate release tablet  Commonly known as:  Oxy IR/ROXICODONE  Take 1-2 tablets (5-10 mg total) by mouth every 6 (six) hours as needed for moderate pain, severe pain or breakthrough pain.     traMADol 50 MG tablet  Commonly known as:  ULTRAM  Take 1-2 tablets (50-100 mg total) by mouth every 6 (six) hours as needed for moderate pain or severe pain.     triamcinolone 55 MCG/ACT Aero nasal inhaler  Commonly known as:  NASACORT  Place 2 sprays into both nostrils daily.     Vitamin A & D 16109-604525000-1000 UNITS Tabs  Take 1 tablet by mouth daily.     VITAMIN C PO  Take 1,000 mg by mouth daily.         Follow-up Information   Follow up with [facility] Call on 03/26/2014. (Call our office for your appointment time, please arrive at least 30 min before your appointment to complete your check in paperwork.  If you are unable to arrive 30 min prior to your appointment time we may have to cancel or reschedule you.)    Contact information:   [address]   [place]  [place] [id number] [phone]       Signed: Candiss NorseMegan Dort, PA-C [facility] [phone]  03/01/2014, 4:22 PM

## 2014-03-01 NOTE — Transfer of Care (Signed)
Immediate Anesthesia Transfer of Care Note  Patient: Amy Golden  Procedure(s) Performed: Procedure(s): APPENDECTOMY LAPAROSCOPIC (N/A)  Patient Location: PACU  Anesthesia Type:General  Level of Consciousness: awake, alert , oriented and patient cooperative  Airway & Oxygen Therapy: Patient Spontanous Breathing and Patient connected to face mask oxygen  Post-op Assessment: Report given to PACU RN and Post -op Vital signs reviewed and stable  Post vital signs: Reviewed and stable  Complications: No apparent anesthesia complications

## 2014-03-01 NOTE — Anesthesia Postprocedure Evaluation (Signed)
  Anesthesia Post-op Note  Patient: Amy Golden  Procedure(s) Performed: Procedure(s) (LRB): APPENDECTOMY LAPAROSCOPIC (N/A)  Patient Location: PACU  Anesthesia Type: General  Level of Consciousness: awake and alert   Airway and Oxygen Therapy: Patient Spontanous Breathing  Post-op Pain: mild  Post-op Assessment: Post-op Vital signs reviewed, Patient's Cardiovascular Status Stable, Respiratory Function Stable, Patent Airway and No signs of Nausea or vomiting  Last Vitals:  Filed Vitals:   03/01/14 1100  BP: 95/56  Pulse: 58  Temp: 36.3 C  Resp: 12    Post-op Vital Signs: stable   Complications: No apparent anesthesia complications

## 2014-03-01 NOTE — Op Note (Signed)
Preoperative Diagnosis: RLQ abdominal pain [R10.31] Acute appendicitis, unspecified acute appendicitis type [K35.80]  Postoprative Diagnosis: RLQ abdominal pain [R10.31] Acute appendicitis, unspecified acute appendicitis type [K35.80]  Procedure: Procedure(s): APPENDECTOMY LAPAROSCOPIC   Surgeon: Glenna FellowsHoxworth, Benjamin T   Assistants:  none  Anesthesia:  General endotracheal anesthesia  Indications: Patient is a 33 year old female with 3 days of worsening right lower quadrant abdominal pain, mildly elevated white count, localized tenderness and a CT scan showing evidence of nonperforated appendicitis. We have recommended proceeding with laparoscopic appendectomy. The procedure and risks of the discussed in detail those where she agrees to proceed  Procedure Detail:  Patient was brought to the operating room, placed in the supine position on the operating table and general endotracheal anesthesia induced. She received preoperative IV antibiotics. PAS were in place. The abdomen was widely sterilely prepped and draped. Foley catheter was placed. Patient timeout was performed and correct procedure verified. Access was obtained at the umbilicus with a 1 cm incision with an open Hassan technique for mattress suture of 0 Vicryl and pneumoperitoneum established. Under direct vision a 5 mm trocar was placed in the right upper quadrant a 12 mm trocar in the left lower quadrant. The appendix was lying in anteromedial and was acutely inflamed without evidence of perforation or gangrene. Lateral peritoneal attachments were divided mobilizing the appendix and cecum. The mesial appendix was sequentially divided with the harmonic scalpel until the appendix was completely free down to the tip of the cecum. The appendix was divided across the tip of the cecum with a single firing of the GIA 45 mm blue load stapler. The staple line was intact and without bleeding. The abdomen was thoroughly Irrigated. The appendix Was  placed in an Endo Catch bag and brought out through the umbilicus. The mattress suture secured at the umbilicus all CO2 evacuated the trochars removed. Skin incisions were closed with subcuticular Monocryl and Dermabond. Sponge needle and instrument counts were correct.    Findings: Acute appendicitis without perforation or gangrene  Estimated Blood Loss:  Minimal         Drains: nnone  Blood Given: none          Specimens: Appendix        Complications:  * No complications entered in OR log *         Disposition: PACU - hemodynamically stable.         Condition: stable

## 2014-03-03 NOTE — ED Provider Notes (Signed)
Medical screening examination/treatment/procedure(s) were conducted as a shared visit with non-physician practitioner(s) or resident and myself. I personally evaluated the patient during the encounter and agree with the findings.  I have personally reviewed any xrays and/ or EKG's with the provider and I agree with interpretation.  Patient with worsening right lower quadrant pain for 3 days and nausea. On exam patient has focal right lower quadrant pain, no peritonitis, mild tachycardia, mild dry mucous membranes overall well-appearing. Plan for blood work and CT look for appendicitis or other cause.  Right lower quadrant abdominal pain   Enid SkeensJoshua M Zavitz, MD 03/03/14 [phone]

## 2014-03-04 ENCOUNTER — Encounter (HOSPITAL_COMMUNITY): Payer: Self-pay | Admitting: General Surgery

## 2014-09-11 ENCOUNTER — Other Ambulatory Visit (HOSPITAL_COMMUNITY): Payer: Self-pay | Admitting: Dermatology

## 2016-07-30 ENCOUNTER — Telehealth (HOSPITAL_COMMUNITY): Payer: Self-pay | Admitting: *Deleted

## 2016-07-30 NOTE — Telephone Encounter (Signed)
I have never seen this person

## 2016-07-30 NOTE — Telephone Encounter (Deleted)
voice message from patient, said she don't think she is taking the right doseage of Prozac.   She would like a phone call please.

## 2016-07-30 NOTE — Telephone Encounter (Signed)
Called pt back due to previous phone call. Per pt she just wanted to verify her dosage for her Prozac. Per pt she did not remember if Dr. Tenny Crawoss told her to take 20 mg or 40 mg of of her Prozac. Informed pt that per her chart, she is suppose to be taking 40 mg and pt verified understanding.

## 2018-06-13 DIAGNOSIS — M9901 Segmental and somatic dysfunction of cervical region: Secondary | ICD-10-CM | POA: Diagnosis not present

## 2018-06-13 DIAGNOSIS — M9907 Segmental and somatic dysfunction of upper extremity: Secondary | ICD-10-CM | POA: Diagnosis not present

## 2018-06-13 DIAGNOSIS — M9902 Segmental and somatic dysfunction of thoracic region: Secondary | ICD-10-CM | POA: Diagnosis not present

## 2018-06-13 DIAGNOSIS — M654 Radial styloid tenosynovitis [de Quervain]: Secondary | ICD-10-CM | POA: Diagnosis not present

## 2018-06-16 DIAGNOSIS — M9902 Segmental and somatic dysfunction of thoracic region: Secondary | ICD-10-CM | POA: Diagnosis not present

## 2018-06-16 DIAGNOSIS — M654 Radial styloid tenosynovitis [de Quervain]: Secondary | ICD-10-CM | POA: Diagnosis not present

## 2018-06-16 DIAGNOSIS — M9907 Segmental and somatic dysfunction of upper extremity: Secondary | ICD-10-CM | POA: Diagnosis not present

## 2018-06-16 DIAGNOSIS — M9901 Segmental and somatic dysfunction of cervical region: Secondary | ICD-10-CM | POA: Diagnosis not present

## 2018-06-19 DIAGNOSIS — M654 Radial styloid tenosynovitis [de Quervain]: Secondary | ICD-10-CM | POA: Diagnosis not present

## 2018-06-19 DIAGNOSIS — M9907 Segmental and somatic dysfunction of upper extremity: Secondary | ICD-10-CM | POA: Diagnosis not present

## 2018-06-19 DIAGNOSIS — M9902 Segmental and somatic dysfunction of thoracic region: Secondary | ICD-10-CM | POA: Diagnosis not present

## 2018-06-19 DIAGNOSIS — M9901 Segmental and somatic dysfunction of cervical region: Secondary | ICD-10-CM | POA: Diagnosis not present

## 2018-06-22 DIAGNOSIS — Z1389 Encounter for screening for other disorder: Secondary | ICD-10-CM | POA: Diagnosis not present

## 2018-06-22 DIAGNOSIS — M9907 Segmental and somatic dysfunction of upper extremity: Secondary | ICD-10-CM | POA: Diagnosis not present

## 2018-06-22 DIAGNOSIS — Z01419 Encounter for gynecological examination (general) (routine) without abnormal findings: Secondary | ICD-10-CM | POA: Diagnosis not present

## 2018-06-22 DIAGNOSIS — M9902 Segmental and somatic dysfunction of thoracic region: Secondary | ICD-10-CM | POA: Diagnosis not present

## 2018-06-22 DIAGNOSIS — M9901 Segmental and somatic dysfunction of cervical region: Secondary | ICD-10-CM | POA: Diagnosis not present

## 2018-06-22 DIAGNOSIS — Z130 Encounter for screening for diseases of the blood and blood-forming organs and certain disorders involving the immune mechanism: Secondary | ICD-10-CM | POA: Diagnosis not present

## 2018-06-22 DIAGNOSIS — M654 Radial styloid tenosynovitis [de Quervain]: Secondary | ICD-10-CM | POA: Diagnosis not present

## 2018-06-22 DIAGNOSIS — Z6825 Body mass index (BMI) 25.0-25.9, adult: Secondary | ICD-10-CM | POA: Diagnosis not present

## 2018-06-23 DIAGNOSIS — Z8742 Personal history of other diseases of the female genital tract: Secondary | ICD-10-CM | POA: Diagnosis not present

## 2018-06-26 DIAGNOSIS — M9902 Segmental and somatic dysfunction of thoracic region: Secondary | ICD-10-CM | POA: Diagnosis not present

## 2018-06-26 DIAGNOSIS — M9907 Segmental and somatic dysfunction of upper extremity: Secondary | ICD-10-CM | POA: Diagnosis not present

## 2018-06-26 DIAGNOSIS — M9901 Segmental and somatic dysfunction of cervical region: Secondary | ICD-10-CM | POA: Diagnosis not present

## 2018-06-26 DIAGNOSIS — M654 Radial styloid tenosynovitis [de Quervain]: Secondary | ICD-10-CM | POA: Diagnosis not present

## 2018-06-29 DIAGNOSIS — M9901 Segmental and somatic dysfunction of cervical region: Secondary | ICD-10-CM | POA: Diagnosis not present

## 2018-06-29 DIAGNOSIS — M9902 Segmental and somatic dysfunction of thoracic region: Secondary | ICD-10-CM | POA: Diagnosis not present

## 2018-06-29 DIAGNOSIS — M9907 Segmental and somatic dysfunction of upper extremity: Secondary | ICD-10-CM | POA: Diagnosis not present

## 2018-06-29 DIAGNOSIS — M654 Radial styloid tenosynovitis [de Quervain]: Secondary | ICD-10-CM | POA: Diagnosis not present

## 2018-07-04 DIAGNOSIS — M654 Radial styloid tenosynovitis [de Quervain]: Secondary | ICD-10-CM | POA: Diagnosis not present

## 2018-07-04 DIAGNOSIS — M9902 Segmental and somatic dysfunction of thoracic region: Secondary | ICD-10-CM | POA: Diagnosis not present

## 2018-07-04 DIAGNOSIS — M9901 Segmental and somatic dysfunction of cervical region: Secondary | ICD-10-CM | POA: Diagnosis not present

## 2018-07-04 DIAGNOSIS — M9907 Segmental and somatic dysfunction of upper extremity: Secondary | ICD-10-CM | POA: Diagnosis not present

## 2018-07-06 DIAGNOSIS — M9901 Segmental and somatic dysfunction of cervical region: Secondary | ICD-10-CM | POA: Diagnosis not present

## 2018-07-06 DIAGNOSIS — M9907 Segmental and somatic dysfunction of upper extremity: Secondary | ICD-10-CM | POA: Diagnosis not present

## 2018-07-06 DIAGNOSIS — M9902 Segmental and somatic dysfunction of thoracic region: Secondary | ICD-10-CM | POA: Diagnosis not present

## 2018-07-06 DIAGNOSIS — M654 Radial styloid tenosynovitis [de Quervain]: Secondary | ICD-10-CM | POA: Diagnosis not present

## 2018-07-19 DIAGNOSIS — Z1329 Encounter for screening for other suspected endocrine disorder: Secondary | ICD-10-CM | POA: Diagnosis not present

## 2018-07-19 DIAGNOSIS — Z13220 Encounter for screening for lipoid disorders: Secondary | ICD-10-CM | POA: Diagnosis not present

## 2018-07-19 DIAGNOSIS — Z0000 Encounter for general adult medical examination without abnormal findings: Secondary | ICD-10-CM | POA: Diagnosis not present

## 2018-07-19 DIAGNOSIS — Z114 Encounter for screening for human immunodeficiency virus [HIV]: Secondary | ICD-10-CM | POA: Diagnosis not present

## 2018-10-04 DIAGNOSIS — D3122 Benign neoplasm of left retina: Secondary | ICD-10-CM | POA: Diagnosis not present

## 2018-10-24 DIAGNOSIS — Z719 Counseling, unspecified: Secondary | ICD-10-CM | POA: Diagnosis not present

## 2018-10-24 DIAGNOSIS — K219 Gastro-esophageal reflux disease without esophagitis: Secondary | ICD-10-CM | POA: Diagnosis not present

## 2018-10-24 DIAGNOSIS — J309 Allergic rhinitis, unspecified: Secondary | ICD-10-CM | POA: Diagnosis not present

## 2018-11-07 DIAGNOSIS — Z719 Counseling, unspecified: Secondary | ICD-10-CM | POA: Diagnosis not present

## 2018-11-07 DIAGNOSIS — J309 Allergic rhinitis, unspecified: Secondary | ICD-10-CM | POA: Diagnosis not present

## 2018-11-07 DIAGNOSIS — J019 Acute sinusitis, unspecified: Secondary | ICD-10-CM | POA: Diagnosis not present

## 2018-11-14 DIAGNOSIS — H9203 Otalgia, bilateral: Secondary | ICD-10-CM | POA: Diagnosis not present

## 2018-11-14 DIAGNOSIS — J309 Allergic rhinitis, unspecified: Secondary | ICD-10-CM | POA: Diagnosis not present

## 2018-11-14 DIAGNOSIS — Z20828 Contact with and (suspected) exposure to other viral communicable diseases: Secondary | ICD-10-CM | POA: Diagnosis not present

## 2018-11-14 DIAGNOSIS — J019 Acute sinusitis, unspecified: Secondary | ICD-10-CM | POA: Diagnosis not present

## 2018-11-15 DIAGNOSIS — Z1159 Encounter for screening for other viral diseases: Secondary | ICD-10-CM | POA: Diagnosis not present

## 2018-11-17 DIAGNOSIS — H6123 Impacted cerumen, bilateral: Secondary | ICD-10-CM | POA: Diagnosis not present

## 2018-11-17 DIAGNOSIS — J019 Acute sinusitis, unspecified: Secondary | ICD-10-CM | POA: Diagnosis not present

## 2018-11-17 DIAGNOSIS — T50905A Adverse effect of unspecified drugs, medicaments and biological substances, initial encounter: Secondary | ICD-10-CM | POA: Diagnosis not present

## 2018-11-22 DIAGNOSIS — H6691 Otitis media, unspecified, right ear: Secondary | ICD-10-CM | POA: Diagnosis not present

## 2018-11-22 DIAGNOSIS — H6091 Unspecified otitis externa, right ear: Secondary | ICD-10-CM | POA: Diagnosis not present

## 2018-11-22 DIAGNOSIS — J019 Acute sinusitis, unspecified: Secondary | ICD-10-CM | POA: Diagnosis not present

## 2018-12-07 DIAGNOSIS — M25519 Pain in unspecified shoulder: Secondary | ICD-10-CM | POA: Diagnosis not present

## 2018-12-07 DIAGNOSIS — R42 Dizziness and giddiness: Secondary | ICD-10-CM | POA: Diagnosis not present

## 2018-12-07 DIAGNOSIS — H6091 Unspecified otitis externa, right ear: Secondary | ICD-10-CM | POA: Diagnosis not present

## 2018-12-27 DIAGNOSIS — M25521 Pain in right elbow: Secondary | ICD-10-CM | POA: Diagnosis not present

## 2019-01-04 DIAGNOSIS — M25521 Pain in right elbow: Secondary | ICD-10-CM | POA: Diagnosis not present

## 2019-01-10 DIAGNOSIS — M25521 Pain in right elbow: Secondary | ICD-10-CM | POA: Diagnosis not present

## 2019-01-10 DIAGNOSIS — M542 Cervicalgia: Secondary | ICD-10-CM | POA: Diagnosis not present

## 2019-01-10 DIAGNOSIS — R293 Abnormal posture: Secondary | ICD-10-CM | POA: Diagnosis not present

## 2019-01-10 DIAGNOSIS — M2560 Stiffness of unspecified joint, not elsewhere classified: Secondary | ICD-10-CM | POA: Diagnosis not present

## 2019-01-10 DIAGNOSIS — M9901 Segmental and somatic dysfunction of cervical region: Secondary | ICD-10-CM | POA: Diagnosis not present

## 2019-01-17 DIAGNOSIS — R42 Dizziness and giddiness: Secondary | ICD-10-CM | POA: Diagnosis not present

## 2019-01-17 DIAGNOSIS — H6983 Other specified disorders of Eustachian tube, bilateral: Secondary | ICD-10-CM | POA: Diagnosis not present

## 2019-01-19 DIAGNOSIS — M25521 Pain in right elbow: Secondary | ICD-10-CM | POA: Diagnosis not present

## 2019-01-24 DIAGNOSIS — M25521 Pain in right elbow: Secondary | ICD-10-CM | POA: Diagnosis not present

## 2019-01-26 DIAGNOSIS — M25521 Pain in right elbow: Secondary | ICD-10-CM | POA: Diagnosis not present

## 2019-01-31 DIAGNOSIS — M25521 Pain in right elbow: Secondary | ICD-10-CM | POA: Diagnosis not present

## 2019-02-02 DIAGNOSIS — M25521 Pain in right elbow: Secondary | ICD-10-CM | POA: Diagnosis not present

## 2019-02-07 DIAGNOSIS — M25521 Pain in right elbow: Secondary | ICD-10-CM | POA: Diagnosis not present

## 2019-02-09 DIAGNOSIS — M25521 Pain in right elbow: Secondary | ICD-10-CM | POA: Diagnosis not present

## 2019-02-14 DIAGNOSIS — M25521 Pain in right elbow: Secondary | ICD-10-CM | POA: Diagnosis not present

## 2019-02-21 DIAGNOSIS — M25521 Pain in right elbow: Secondary | ICD-10-CM | POA: Diagnosis not present

## 2019-02-23 DIAGNOSIS — M25521 Pain in right elbow: Secondary | ICD-10-CM | POA: Diagnosis not present

## 2019-02-28 DIAGNOSIS — M25521 Pain in right elbow: Secondary | ICD-10-CM | POA: Diagnosis not present

## 2019-03-02 DIAGNOSIS — M25521 Pain in right elbow: Secondary | ICD-10-CM | POA: Diagnosis not present

## 2019-03-06 DIAGNOSIS — M25521 Pain in right elbow: Secondary | ICD-10-CM | POA: Diagnosis not present

## 2019-03-07 DIAGNOSIS — Z23 Encounter for immunization: Secondary | ICD-10-CM | POA: Diagnosis not present

## 2019-03-08 ENCOUNTER — Other Ambulatory Visit: Payer: Self-pay

## 2019-03-08 DIAGNOSIS — Z20822 Contact with and (suspected) exposure to covid-19: Secondary | ICD-10-CM

## 2019-03-09 LAB — NOVEL CORONAVIRUS, NAA: SARS-CoV-2, NAA: NOT DETECTED

## 2019-04-18 ENCOUNTER — Ambulatory Visit: Payer: Self-pay

## 2019-04-18 ENCOUNTER — Ambulatory Visit (INDEPENDENT_AMBULATORY_CARE_PROVIDER_SITE_OTHER): Payer: Managed Care, Other (non HMO) | Admitting: Family Medicine

## 2019-04-18 ENCOUNTER — Encounter: Payer: Self-pay | Admitting: Family Medicine

## 2019-04-18 VITALS — BP 102/70 | HR 89 | Ht 65.0 in

## 2019-04-18 DIAGNOSIS — M25531 Pain in right wrist: Secondary | ICD-10-CM

## 2019-04-18 DIAGNOSIS — M25532 Pain in left wrist: Secondary | ICD-10-CM

## 2019-04-18 DIAGNOSIS — M67431 Ganglion, right wrist: Secondary | ICD-10-CM

## 2019-04-18 DIAGNOSIS — M67432 Ganglion, left wrist: Secondary | ICD-10-CM | POA: Insufficient documentation

## 2019-04-18 MED ORDER — GABAPENTIN 100 MG PO CAPS: 200.0000 mg | ORAL_CAPSULE | Freq: Every day | ORAL | 3 refills | Status: DC

## 2019-04-18 MED ORDER — VITAMIN D (ERGOCALCIFEROL) 1.25 MG (50000 UNIT) PO CAPS: 50000.0000 [IU] | ORAL_CAPSULE | ORAL | 0 refills | Status: DC

## 2019-04-18 NOTE — Progress Notes (Signed)
[facility] [address] [place], [place] [address] Phone: [phone] Subjective:   I Amy Golden am serving as a Neurosurgeon for Dr. Antoine Primas.   This visit occurred during the SARS-CoV-2 public health emergency.  Safety protocols were in place, including screening questions prior to the visit, additional usage of staff PPE, and extensive cleaning of exam room while observing appropriate contact time as indicated for disinfecting solutions.     CC: Wrist pain  TDD:[id number]  Amy Golden is a 38 y.o. female coming in with complaint of bilateral wrist pain. History of surgery in the right wrist. Patient states she has had tennis elbow right elbow. Recently had injection in the wrist as well as MRI of her hand. Pain radiates up the right arm. Went to occupational theray for about 6 weeks that took care of the tennis elbow. Left wrist feels like a ganglion cyst. Patient states that during menstruation the pain is worse. Bilateral loss of ROM. Patient can't tell if she is experiencing weakness. Uses computers at work.   Onset- chronic  Location - lateral left wrist pain Duration-  Character- sharp, dull, achy, sore, throbbing Aggravating factors- ulnar/radial deviation right arm Reliving factors-  Therapies tried- heat, ibuprofen  Severity-   7/10 at its worse    Reviewed patient's MRI independently.  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    No past medical history on file. Past Surgical History:  Procedure Laterality Date  . LAPAROSCOPIC APPENDECTOMY N/A 03/01/2014   Procedure: APPENDECTOMY LAPAROSCOPIC;  Surgeon: Glenna Fellows, MD;  Location: [facility];  Service: General;  Laterality: N/A;  . WRIST SURGERY     Social History   Socioeconomic History  . Marital status: Single    Spouse name: Not on file  . Number of children: Not on  file  . Years of education: Not on file  . Highest education level: Not on file  Occupational History  . Not on file  Social Needs  . Financial resource strain: Not on file  . Food insecurity    Worry: Not on file    Inability: Not on file  . Transportation needs    Medical: Not on file    Non-medical: Not on file  Tobacco Use  . Smoking status: Light Tobacco Smoker    Types: Cigarettes  Substance and Sexual Activity  . Alcohol use: Yes    Comment: occasional  . Drug use: No  . Sexual activity: Yes  Lifestyle  . Physical activity    Days per week: Not on file    Minutes per session: Not on file  . Stress: Not on file  Relationships  . Social Musician on phone: Not on file    Gets together: Not on file    Attends religious service: Not on file    Active member of club or organization: Not on file    Attends meetings of clubs or organizations: Not on file    Relationship status: Not on file  Other Topics Concern  . Not on file  Social History Narrative  . Not on file   Allergies  Allergen Reactions  . Codeine Hives  . Penicillins Hives and Swelling  . Wellbutrin [Bupropion] Other (See Comments)    Swelling Chest  . Amoxicillin Itching and Rash   No family history on file.  Current Outpatient Medications (Respiratory):  .  fexofenadine (ALLEGRA) 180 MG tablet, Take 180 mg by mouth daily. Marland Kitchen.  triamcinolone (NASACORT) 55 MCG/ACT AERO nasal inhaler, Place 2 sprays into both nostrils daily.  Current Outpatient Medications (Analgesics):  .  acetaminophen (TYLENOL) 325 MG tablet, Take 2 tablets (650 mg total) by mouth every 6 (six) hours as needed for mild pain (or Temp > 100). Marland Kitchen.  oxyCODONE (OXY IR/ROXICODONE) 5 MG immediate release tablet, Take 1-2 tablets (5-10 mg total) by mouth every 6 (six) hours as needed for moderate pain, severe pain or breakthrough pain. .  traMADol (ULTRAM) 50 MG tablet, Take 1-2 tablets (50-100 mg total) by mouth every 6 (six)  hours as needed for moderate pain or severe pain.   Current Outpatient Medications (Other):  Marland Kitchen.  Ascorbic Acid (VITAMIN C PO), Take 1,000 mg by mouth daily. Marland Kitchen.  b complex vitamins tablet, Take 1 tablet by mouth daily. Marland Kitchen.  BIOTIN PO, Take 10,000 mg by mouth daily. Marland Kitchen.  docusate sodium (COLACE) 100 MG capsule, Take 1 capsule (100 mg total) by mouth 2 (two) times daily as needed for mild constipation or moderate constipation. Marland Kitchen.  MAGNESIUM PO, Take 1 tablet by mouth daily. .  Vitamins A & D (VITAMIN A & D) 16109-604525000-1000 UNITS TABS, Take 1 tablet by mouth daily. Marland Kitchen.  gabapentin (NEURONTIN) 100 MG capsule, Take 2 capsules (200 mg total) by mouth at bedtime. .  Vitamin D, Ergocalciferol, (DRISDOL) 1.25 MG (50000 UT) CAPS capsule, Take 1 capsule (50,000 Units total) by mouth every 7 (seven) days.    Past medical history, social, surgical and family history all reviewed in electronic medical record.  No pertanent information unless stated regarding to the chief complaint.   Review of Systems:  No headache, visual changes, nausea, vomiting, diarrhea, constipation, dizziness, abdominal pain, skin rash, fevers, chills, night sweats, weight loss, swollen lymph nodes, body aches, joint swelling, muscle aches, chest pain, shortness of breath, mood changes.   Objective  Blood pressure 102/70, pulse 89, height 5\' 5"  (1.651 Golden), SpO2 97 %. Systems examined below as of    General: No apparent distress alert and oriented x3 mood and affect normal, dressed appropriately.  HEENT: Pupils equal, extraocular movements intact  Respiratory: Patient's speak in full sentences and does not appear short of breath  Cardiovascular: No lower extremity edema, non tender, no erythema  Skin: Warm dry intact with no signs of infection or rash on extremities or on axial skeleton.  Abdomen: Soft nontender  Neuro: Cranial nerves II through XII are intact, neurovascularly intact in all extremities with 2+ DTRs and 2+ pulses.  Lymph: No  lymphadenopathy of posterior or anterior cervical chain or axillae bilaterally.  Gait normal with good balance and coordination.  MSK:  Non tender with full range of motion and good stability and symmetric strength and tone of shoulders,  hip, knee and ankles bilaterally.  Right wrist exam shows the patient does have some tenderness to palpation over the dorsal aspect.  Questionable cyst noted.  Full range of motion of the wrist noted.  Good grip strength.  Pain over the radial nerve proximally.  Minimal pain over the lateral epicondylar region.  Negative pain with resisted wrist extension  Contralateral wrist shows significant swelling and likely cyst formation on the ulnar aspect of the wrist.  Otherwise unremarkable.  Limited musculoskeletal ultrasound was performed and interpreted by Amy Golden   Limited ultrasound of patient's right wrist shows the patient does have small scar tissue  formation on the third compartment of the extensor tendons.  Increasing Doppler flow in the area.  Underlying fluid noted as well and what appears to be either a seroma not likely a ganglion cyst.  Left wrist does show a ganglion cyst just inferior to the extensor carpi ulnaris tendon sheath  Impression: Chronic scar tissues from previous surgery of the right wrist and ganglion cyst of the left wrist   Impression and Recommendations:     This case required medical decision making of moderate complexity. The above documentation has been reviewed and is accurate and complete Amy Pulley, DO       Note: This dictation was prepared with Dragon dictation along with smaller phrase technology. Any transcriptional errors that result from this process are unintentional.

## 2019-04-18 NOTE — Assessment & Plan Note (Signed)
Patient has what appears to be a ganglion cyst on the dorsal aspect of the right wrist as well as some scar tissue formation with chronic extensor tendinitis.  Patient has had surgical intervention previously.  Discussed with patient about the possibility of injection which patient declined, will be bracing, over-the-counter topical anti-inflammatories, vitamin D supplementation.  Patient did have some radial nerve irritation.  Discussed which activities of doing which wants to avoid.  Patient will increase activity slowly over the course the next several weeks.  Follow-up again 4 weeks and consider aspiration

## 2019-04-18 NOTE — Assessment & Plan Note (Signed)
Left ganglion cyst on the ulnar aspect of the wrist.  Will follow up and if worsening symptoms will consider aspiration

## 2019-04-18 NOTE — Patient Instructions (Addendum)
  [address], [address]st [address] [place], [place] [address] Phone [phone]  Good to see you Happy Holidays! Ice 20 minutes 2 times daily. Usually after activity and before bed. Exercises 3 times a week.  Gabapentin 200 mg at night  Once weekly vitamin D for 12 weeks.  Turmeric 500mg  daily  1500 mg calcium pyruvate Right wrist brace day and night for 2 weeks nightly for 1 week See me again in 6-8 weeks

## 2019-05-31 ENCOUNTER — Encounter: Payer: Self-pay | Admitting: Family Medicine

## 2019-05-31 ENCOUNTER — Ambulatory Visit (INDEPENDENT_AMBULATORY_CARE_PROVIDER_SITE_OTHER): Payer: BC Managed Care – PPO | Admitting: Family Medicine

## 2019-05-31 ENCOUNTER — Other Ambulatory Visit: Payer: Self-pay

## 2019-05-31 DIAGNOSIS — M25521 Pain in right elbow: Secondary | ICD-10-CM | POA: Insufficient documentation

## 2019-05-31 NOTE — Assessment & Plan Note (Signed)
Right elbow pain.  Discussed with patient in great length.  We discussed the possibility of injections first.  Imaging.  Patient is to start increasing activity.  I believe the patient will do relatively well.  We discussed the repetitive extension to monitor.  Follow-up with me again in 6 weeks

## 2019-05-31 NOTE — Patient Instructions (Signed)
Thicker grip on tennis racquet Exercises 3x a week See me again in 6 weeks

## 2019-05-31 NOTE — Progress Notes (Signed)
[name] [address] [place] [address] Phone: [phone] Subjective:   Bruce Donath, am serving as a scribe for Dr. Antoine Primas. This visit occurred during the SARS-CoV-2 public health emergency.  Safety protocols were in place, including screening questions prior to the visit, additional usage of staff PPE, and extensive cleaning of exam room while observing appropriate contact time as indicated for disinfecting solutions.   I'm seeing this patient by the request  of:  Patient, No Pcp Per  CC: Right elbow pain follow-up, wrist pain follow-up  [id number]   04/18/2019 Patient has what appears to be a ganglion cyst on the dorsal aspect of the right wrist as well as some scar tissue formation with chronic extensor tendinitis.  Patient has had surgical intervention previously.  Discussed with patient about the possibility of injection which patient declined, will be bracing, over-the-counter topical anti-inflammatories, vitamin D supplementation.  Patient did have some radial nerve irritation.  Discussed which activities of doing which wants to avoid.  Patient will increase activity slowly over the course the next several weeks.  Follow-up again 4 weeks and consider aspiration  Update 05/31/2019 Odie Rauen is a 39 y.o. female coming in with complaint of right elbow and left wrist pain. States that she is doing much better. Feels pain if she squeezes wrist she will have pain. Has not been playing tennis.  Patient is also not been doing the exercises regularly.  Continues to have pain on a fairly regular basis with some certain movements but nothing as severe as what it was previously.      No past medical history on file. Past Surgical History:  Procedure Laterality Date  . LAPAROSCOPIC APPENDECTOMY N/A 03/01/2014   Procedure: APPENDECTOMY LAPAROSCOPIC;  Surgeon: Glenna Fellows, MD;  Location: [facility];  Service: General;  Laterality: N/A;  .  WRIST SURGERY     Social History   Socioeconomic History  . Marital status: Single    Spouse name: Not on file  . Number of children: Not on file  . Years of education: Not on file  . Highest education level: Not on file  Occupational History  . Not on file  Tobacco Use  . Smoking status: Light Tobacco Smoker    Types: Cigarettes  Substance and Sexual Activity  . Alcohol use: Yes    Comment: occasional  . Drug use: No  . Sexual activity: Yes  Other Topics Concern  . Not on file  Social History Narrative  . Not on file   Social Determinants of Health   Financial Resource Strain:   . Difficulty of Paying Living Expenses: Not on file  Food Insecurity:   . Worried About Programme researcher, broadcasting/film/video in the Last Year: Not on file  . Ran Out of Food in the Last Year: Not on file  Transportation Needs:   . Lack of Transportation (Medical): Not on file  . Lack of Transportation (Non-Medical): Not on file  Physical Activity:   . Days of Exercise per Week: Not on file  . Minutes of Exercise per Session: Not on file  Stress:   . Feeling of Stress : Not on file  Social Connections:   . Frequency of Communication with Friends and Family: Not on file  . Frequency of Social Gatherings with Friends and Family: Not on file  . Attends Religious Services: Not on file  . Active Member of Clubs or Organizations: Not on file  . Attends Club  or Organization Meetings: Not on file  . Marital Status: Not on file   Allergies  Allergen Reactions  . Codeine Hives  . Penicillins Hives and Swelling  . Wellbutrin [Bupropion] Other (See Comments)    Swelling Chest  . Amoxicillin Itching and Rash   No family history on file.    Current Outpatient Medications (Respiratory):  .  fexofenadine (ALLEGRA) 180 MG tablet, Take 180 mg by mouth daily. Marland Kitchen  triamcinolone (NASACORT) 55 MCG/ACT AERO nasal inhaler, Place 2 sprays into both nostrils daily.  Current Outpatient Medications (Analgesics):  .   acetaminophen (TYLENOL) 325 MG tablet, Take 2 tablets (650 mg total) by mouth every 6 (six) hours as needed for mild pain (or Temp > 100). Marland Kitchen  oxyCODONE (OXY IR/ROXICODONE) 5 MG immediate release tablet, Take 1-2 tablets (5-10 mg total) by mouth every 6 (six) hours as needed for moderate pain, severe pain or breakthrough pain. .  traMADol (ULTRAM) 50 MG tablet, Take 1-2 tablets (50-100 mg total) by mouth every 6 (six) hours as needed for moderate pain or severe pain.   Current Outpatient Medications (Other):  Marland Kitchen  Ascorbic Acid (VITAMIN C PO), Take 1,000 mg by mouth daily. Marland Kitchen  b complex vitamins tablet, Take 1 tablet by mouth daily. Marland Kitchen  BIOTIN PO, Take 10,000 mg by mouth daily. Marland Kitchen  docusate sodium (COLACE) 100 MG capsule, Take 1 capsule (100 mg total) by mouth 2 (two) times daily as needed for mild constipation or moderate constipation. .  gabapentin (NEURONTIN) 100 MG capsule, Take 2 capsules (200 mg total) by mouth at bedtime. Marland Kitchen  MAGNESIUM PO, Take 1 tablet by mouth daily. .  Vitamin D, Ergocalciferol, (DRISDOL) 1.25 MG (50000 UT) CAPS capsule, Take 1 capsule (50,000 Units total) by mouth every 7 (seven) days. .  Vitamins A & D (VITAMIN A & D) [address] UNITS TABS, Take 1 tablet by mouth daily.    Past medical history, social, surgical and family history all reviewed in electronic medical record.  No pertanent information unless stated regarding to the chief complaint.   Review of Systems:  No headache, visual changes, nausea, vomiting, diarrhea, constipation, dizziness, abdominal pain, skin rash, fevers, chills, night sweats, weight loss, swollen lymph nodes, body aches, joint swelling, chest pain, shortness of breath, mood changes. POSITIVE muscle aches  Objective  Blood pressure 110/70, pulse 72, height 5\' 5"  (1.651 m), SpO2 96 %.   General: No apparent distress alert and oriented x3 mood and affect normal, dressed appropriately.  HEENT: Pupils equal, extraocular movements intact    Respiratory: Patient's speak in full sentences and does not appear short of breath  Cardiovascular: No lower extremity edema, non tender, no erythema  Skin: Warm dry intact with no signs of infection or rash on extremities or on axial skeleton.  Abdomen: Soft nontender  Neuro: Cranial nerves II through XII are intact, neurovascularly intact in all extremities with 2+ DTRs and 2+ pulses.  Lymph: No lymphadenopathy of posterior or anterior cervical chain or axillae bilaterally.  Gait normal with good balance and coordination.  MSK:  Non tender with full range of motion and good stability and symmetric strength and tone of shoulders,  wrist, hip, knee and ankles bilaterally.  Right elbow exam does have some fullness noted with some hypertrophy noted.  Still tender to palpation more in the muscle of the common extensor.    Impression and Recommendations:     The above documentation has been reviewed and is accurate and complete Lyndal Pulley,  DO       Note: This dictation was prepared with Dragon dictation along with smaller phrase technology. Any transcriptional errors that result from this process are unintentional.

## 2019-07-07 ENCOUNTER — Other Ambulatory Visit: Payer: Self-pay | Admitting: Family Medicine

## 2019-07-12 ENCOUNTER — Ambulatory Visit: Payer: BC Managed Care – PPO | Admitting: Family Medicine

## 2019-07-25 DIAGNOSIS — Z131 Encounter for screening for diabetes mellitus: Secondary | ICD-10-CM | POA: Diagnosis not present

## 2019-07-25 DIAGNOSIS — Z13220 Encounter for screening for lipoid disorders: Secondary | ICD-10-CM | POA: Diagnosis not present

## 2019-07-25 DIAGNOSIS — Z0000 Encounter for general adult medical examination without abnormal findings: Secondary | ICD-10-CM | POA: Diagnosis not present

## 2019-08-09 ENCOUNTER — Ambulatory Visit (INDEPENDENT_AMBULATORY_CARE_PROVIDER_SITE_OTHER): Payer: BC Managed Care – PPO | Admitting: Family Medicine

## 2019-08-09 ENCOUNTER — Ambulatory Visit (INDEPENDENT_AMBULATORY_CARE_PROVIDER_SITE_OTHER): Payer: BC Managed Care – PPO

## 2019-08-09 ENCOUNTER — Encounter: Payer: Self-pay | Admitting: Family Medicine

## 2019-08-09 ENCOUNTER — Other Ambulatory Visit: Payer: Self-pay

## 2019-08-09 VITALS — BP 100/80 | HR 75 | Ht 65.0 in | Wt 154.0 lb

## 2019-08-09 DIAGNOSIS — M25521 Pain in right elbow: Secondary | ICD-10-CM | POA: Diagnosis not present

## 2019-08-09 NOTE — Assessment & Plan Note (Addendum)
Chronic problem worsening.  Injection given today.  Tolerated the procedure well.  Discussed again about the grip, weight of bracket, name Hunter, home exercises, over-the-counter medications, gabapentin.  Follow-up again 4 to 8 weeks

## 2019-08-09 NOTE — Progress Notes (Addendum)
[facility] [address] [place] [address] Phone: [phone] Subjective:    This visit occurred during the SARS-CoV-2 public health emergency.  Safety protocols were in place, including screening questions prior to the visit, additional usage of staff PPE, and extensive cleaning of exam room while observing appropriate contact time as indicated for disinfecting solutions.   I'm seeing this patient by the request  of:  Jarrett Soho, PA-C  CC: Right elbow pain follow-up  [id number]   05/31/2019 Right elbow pain.  Discussed with patient in great length.  We discussed the possibility of injections first.  Imaging.  Patient is to start increasing activity.  I believe the patient will do relatively well.  We discussed the repetitive extension to monitor.  Follow-up with me again in 6 weeks  08/09/2019 Amy Golden is a 39 y.o. female coming in with complaint of right elbow pain and L wrist pain.  She was improving at her last visit but had not been playing tennis.  She has been provided a HEP but was not doing this consistently.  Since her last visit, pt reports that she would like an injection. Has been playing tennis and noticed the racket is what is causing the pain. Pain radiates to the forearm.         History reviewed. No pertinent past medical history. Past Surgical History:  Procedure Laterality Date  . LAPAROSCOPIC APPENDECTOMY N/A 03/01/2014   Procedure: APPENDECTOMY LAPAROSCOPIC;  Surgeon: Glenna Fellows, MD;  Location: [facility];  Service: General;  Laterality: N/A;  . WRIST SURGERY     Social History   Socioeconomic History  . Marital status: Single    Spouse name: Not on file  . Number of children: Not on file  . Years of education: Not on file  . Highest education level: Not on file  Occupational History  . Not on file  Tobacco Use  . Smoking status: Light Tobacco Smoker    Types: Cigarettes  Substance and Sexual Activity   . Alcohol use: Yes    Comment: occasional  . Drug use: No  . Sexual activity: Yes  Other Topics Concern  . Not on file  Social History Narrative  . Not on file   Social Determinants of Health   Financial Resource Strain:   . Difficulty of Paying Living Expenses:   Food Insecurity:   . Worried About Programme researcher, broadcasting/film/video in the Last Year:   . Barista in the Last Year:   Transportation Needs:   . Freight forwarder (Medical):   Marland Kitchen Lack of Transportation (Non-Medical):   Physical Activity:   . Days of Exercise per Week:   . Minutes of Exercise per Session:   Stress:   . Feeling of Stress :   Social Connections:   . Frequency of Communication with Friends and Family:   . Frequency of Social Gatherings with Friends and Family:   . Attends Religious Services:   . Active Member of Clubs or Organizations:   . Attends Banker Meetings:   Marland Kitchen Marital Status:    Allergies  Allergen Reactions  . Codeine Hives  . Penicillins Hives and Swelling  . Wellbutrin [Bupropion] Other (See Comments)    Swelling Chest  . Amoxicillin Itching and Rash   History reviewed. No pertinent family history.    Current Outpatient Medications (Respiratory):  .  fexofenadine (ALLEGRA) 180 MG tablet, Take 180 mg by mouth daily. Marland Kitchen  triamcinolone (NASACORT) 55 MCG/ACT AERO nasal inhaler, Place 2 sprays into both nostrils daily.  Current Outpatient Medications (Analgesics):  .  acetaminophen (TYLENOL) 325 MG tablet, Take 2 tablets (650 mg total) by mouth every 6 (six) hours as needed for mild pain (or Temp > 100). Marland Kitchen  oxyCODONE (OXY IR/ROXICODONE) 5 MG immediate release tablet, Take 1-2 tablets (5-10 mg total) by mouth every 6 (six) hours as needed for moderate pain, severe pain or breakthrough pain. .  traMADol (ULTRAM) 50 MG tablet, Take 1-2 tablets (50-100 mg total) by mouth every 6 (six) hours as needed for moderate pain or severe pain.   Current Outpatient Medications (Other):    Marland Kitchen  Ascorbic Acid (VITAMIN C PO), Take 1,000 mg by mouth daily. Marland Kitchen  b complex vitamins tablet, Take 1 tablet by mouth daily. Marland Kitchen  BIOTIN PO, Take 10,000 mg by mouth daily. Marland Kitchen  docusate sodium (COLACE) 100 MG capsule, Take 1 capsule (100 mg total) by mouth 2 (two) times daily as needed for mild constipation or moderate constipation. .  gabapentin (NEURONTIN) 100 MG capsule, Take 2 capsules (200 mg total) by mouth at bedtime. Marland Kitchen  MAGNESIUM PO, Take 1 tablet by mouth daily. .  Vitamin D, Ergocalciferol, (DRISDOL) 1.25 MG (50000 UNIT) CAPS capsule, TAKE 1 CAPSULE (50,000 UNITS TOTAL) BY MOUTH EVERY 7 (SEVEN) DAYS. Marland Kitchen  Vitamins A & D (VITAMIN A & D) [address] UNITS TABS, Take 1 tablet by mouth daily.   Reviewed prior external information including notes and imaging from  primary care provider As well as notes that were available from care everywhere and other healthcare systems.  Past medical history, social, surgical and family history all reviewed in electronic medical record.  No pertanent information unless stated regarding to the chief complaint.   Review of Systems:  No headache, visual changes, nausea, vomiting, diarrhea, constipation, dizziness, abdominal pain, skin rash, fevers, chills, night sweats, weight loss, swollen lymph nodes, body aches, joint swelling, chest pain, shortness of breath, mood changes. POSITIVE muscle aches  Objective  Blood pressure 100/80, pulse 75, height 5\' 5"  (1.651 m), weight 154 lb (69.9 kg), SpO2 98 %.   General: No apparent distress alert and oriented x3 mood and affect normal, dressed appropriately.  HEENT: Pupils equal, extraocular movements intact  Respiratory: Patient's speak in full sentences and does not appear short of breath  Cardiovascular: No lower extremity edema, non tender, no erythema  Neuro: Cranial nerves II through XII are intact, neurovascularly intact in all extremities with 2+ DTRs and 2+ pulses.  Gait normal with good balance and  coordination.  MSK: Right elbow exam shows the patient does have tender to palpation of the lateral epicondylar region.  Pain with resisted wrist extension noted.  No swelling noted.  Mild pain over the radial nerve itself in the mid forearm  Procedure: Real-time Ultrasound Guided Injection of right extensor common tendon sheath Device: GE Logiq Q7 Ultrasound guided injection is preferred based studies that show increased duration, increased effect, greater accuracy, decreased procedural pain, increased response rate, and decreased cost with ultrasound guided versus blind injection.  Verbal informed consent obtained.  Time-out conducted.  Noted no overlying erythema, induration, or other signs of local infection.  Skin prepped in a sterile fashion.  Local anesthesia: Topical Ethyl chloride.  With sterile technique and under real time ultrasound guidance: With a 25-gauge half inch needle injected with 0.5 cc of 0.5% Marcaine and 0.5 cc of 40 mg/mL of kenalog into common extensor sheath  Completed  without difficulty  Pain immediately resolved suggesting accurate placement of the medication.  Advised to call if fevers/chills, erythema, induration, drainage, or persistent bleeding.  Images permanently stored and available for review in the ultrasound unit.  Impression: Technically successful ultrasound guided injection.    Impression and Recommendations:     This case required medical decision making of moderate complexity. The above documentation has been reviewed and is accurate and complete Judi Saa, DO       Note: This dictation was prepared with Dragon dictation along with smaller phrase technology. Any transcriptional errors that result from this process are unintentional.

## 2019-08-09 NOTE — Patient Instructions (Addendum)
Good to see you Take a break on the exercises start again Monday Figure out the racket See me again in 6 weeks

## 2019-08-15 DIAGNOSIS — Z6825 Body mass index (BMI) 25.0-25.9, adult: Secondary | ICD-10-CM | POA: Diagnosis not present

## 2019-08-15 DIAGNOSIS — Z01419 Encounter for gynecological examination (general) (routine) without abnormal findings: Secondary | ICD-10-CM | POA: Diagnosis not present

## 2019-08-15 DIAGNOSIS — Z130 Encounter for screening for diseases of the blood and blood-forming organs and certain disorders involving the immune mechanism: Secondary | ICD-10-CM | POA: Diagnosis not present

## 2019-08-15 DIAGNOSIS — Z1389 Encounter for screening for other disorder: Secondary | ICD-10-CM | POA: Diagnosis not present

## 2019-08-15 DIAGNOSIS — Z1231 Encounter for screening mammogram for malignant neoplasm of breast: Secondary | ICD-10-CM | POA: Diagnosis not present

## 2019-09-24 ENCOUNTER — Encounter: Payer: Self-pay | Admitting: Family Medicine

## 2019-09-24 ENCOUNTER — Ambulatory Visit: Payer: Self-pay

## 2019-09-24 ENCOUNTER — Ambulatory Visit (INDEPENDENT_AMBULATORY_CARE_PROVIDER_SITE_OTHER): Payer: BC Managed Care – PPO | Admitting: Family Medicine

## 2019-09-24 ENCOUNTER — Other Ambulatory Visit: Payer: Self-pay

## 2019-09-24 VITALS — BP 100/74 | HR 60 | Ht 65.0 in | Wt 155.0 lb

## 2019-09-24 DIAGNOSIS — M25521 Pain in right elbow: Secondary | ICD-10-CM

## 2019-09-24 NOTE — Progress Notes (Signed)
[facility] [address] [place] Phone: [phone] Subjective:   I Amy Golden am serving as a Education administrator for Dr. Hulan Saas.  This visit occurred during the SARS-CoV-2 public health emergency.  Safety protocols were in place, including screening questions prior to the visit, additional usage of staff PPE, and extensive cleaning of exam room while observing appropriate contact time as indicated for disinfecting solutions.   I'm seeing this patient by the request  of:  Marda Stalker, PA-C  CC: Right elbow pain follow-up  [id number]   08/09/2019 Chronic problem worsening.  Injection given today.  Tolerated the procedure well.  Discussed again about the grip, weight of bracket, name Hunter, home exercises, over-the-counter medications, gabapentin.  Follow-up again 4 to 8 weeks  09/24/2019 Amy Golden is a 39 y.o. female coming in with complaint of right elbow pain. Patient states she is still in pain while playing tennis. She feels like she is making some progress. Pain doesn't stop her from playing.  Patient does state that she is much better overall just some mild discomfort from time to time     No past medical history on file. Past Surgical History:  Procedure Laterality Date  . LAPAROSCOPIC APPENDECTOMY N/A 03/01/2014   Procedure: APPENDECTOMY LAPAROSCOPIC;  Surgeon: Excell Seltzer, MD;  Location: [facility];  Service: General;  Laterality: N/A;  . WRIST SURGERY     Social History   Socioeconomic History  . Marital status: Single    Spouse name: Not on file  . Number of children: Not on file  . Years of education: Not on file  . Highest education level: Not on file  Occupational History  . Not on file  Tobacco Use  . Smoking status: Light Tobacco Smoker    Types: Cigarettes  Substance and Sexual Activity  . Alcohol use: Yes    Comment: occasional  . Drug use: No  . Sexual activity: Yes  Other Topics Concern  .  Not on file  Social History Narrative  . Not on file   Social Determinants of Health   Financial Resource Strain:   . Difficulty of Paying Living Expenses:   Food Insecurity:   . Worried About Charity fundraiser in the Last Year:   . Arboriculturist in the Last Year:   Transportation Needs:   . Film/video editor (Medical):   Marland Kitchen Lack of Transportation (Non-Medical):   Physical Activity:   . Days of Exercise per Week:   . Minutes of Exercise per Session:   Stress:   . Feeling of Stress :   Social Connections:   . Frequency of Communication with Friends and Family:   . Frequency of Social Gatherings with Friends and Family:   . Attends Religious Services:   . Active Member of Clubs or Organizations:   . Attends Archivist Meetings:   Marland Kitchen Marital Status:    Allergies  Allergen Reactions  . Codeine Hives  . Penicillins Hives and Swelling  . Wellbutrin [Bupropion] Other (See Comments)    Swelling Chest  . Amoxicillin Itching and Rash   No family history on file.    Current Outpatient Medications (Respiratory):  .  fexofenadine (ALLEGRA) 180 MG tablet, Take 180 mg by mouth daily. Marland Kitchen  triamcinolone (NASACORT) 55 MCG/ACT AERO nasal inhaler, Place 2 sprays into both nostrils daily.  Current Outpatient Medications (Analgesics):  .  acetaminophen (TYLENOL) 325 MG tablet, Take 2 tablets (650 mg  total) by mouth every 6 (six) hours as needed for mild pain (or Temp > 100). Marland Kitchen  oxyCODONE (OXY IR/ROXICODONE) 5 MG immediate release tablet, Take 1-2 tablets (5-10 mg total) by mouth every 6 (six) hours as needed for moderate pain, severe pain or breakthrough pain. .  traMADol (ULTRAM) 50 MG tablet, Take 1-2 tablets (50-100 mg total) by mouth every 6 (six) hours as needed for moderate pain or severe pain.   Current Outpatient Medications (Other):  Marland Kitchen  Ascorbic Acid (VITAMIN C PO), Take 1,000 mg by mouth daily. Marland Kitchen  b complex vitamins tablet, Take 1 tablet by mouth daily. Marland Kitchen   BIOTIN PO, Take 10,000 mg by mouth daily. Marland Kitchen  docusate sodium (COLACE) 100 MG capsule, Take 1 capsule (100 mg total) by mouth 2 (two) times daily as needed for mild constipation or moderate constipation. .  gabapentin (NEURONTIN) 100 MG capsule, Take 2 capsules (200 mg total) by mouth at bedtime. Marland Kitchen  MAGNESIUM PO, Take 1 tablet by mouth daily. .  Vitamin D, Ergocalciferol, (DRISDOL) 1.25 MG (50000 UNIT) CAPS capsule, TAKE 1 CAPSULE (50,000 UNITS TOTAL) BY MOUTH EVERY 7 (SEVEN) DAYS. Marland Kitchen  Vitamins A & D (VITAMIN A & D) [address] UNITS TABS, Take 1 tablet by mouth daily.   Reviewed prior external information including notes and imaging from  primary care provider As well as notes that were available from care everywhere and other healthcare systems.  Past medical history, social, surgical and family history all reviewed in electronic medical record.  No pertanent information unless stated regarding to the chief complaint.   Review of Systems:  No headache, visual changes, nausea, vomiting, diarrhea, constipation, dizziness, abdominal pain, skin rash, fevers, chills, night sweats, weight loss, swollen lymph nodes, body aches, joint swelling, chest pain, shortness of breath, mood changes. POSITIVE muscle aches  Objective  Blood pressure 100/74, pulse 60, height 5\' 5"  (1.651 m), weight 155 lb (70.3 kg), SpO2 98 %.   General: No apparent distress alert and oriented x3 mood and affect normal, dressed appropriately.  HEENT: Pupils equal, extraocular movements intact  Respiratory: Patient's speak in full sentences and does not appear short of breath  Cardiovascular: No lower extremity edema, non tender, no erythema  Neuro: Cranial nerves II through XII are intact, neurovascularly intact in all extremities with 2+ DTRs and 2+ pulses.  Gait normal with good balance and coordination.  MSK: Right wrist exam shows that patient does have tenderness to palpation in the lateral epicondylar region.  Some mild  pain with resisted extension but improved from previous exam.  Limited musculoskeletal ultrasound was performed and interpreted by .me  Limited ultrasound of patient's elbow shows that patient does have improvement with decreased hypoechoic changes and increasing neovascularization in Doppler flow.  Mild scar tissue formation noted.  No retraction noted. Impression: Interval healing    Impression and Recommendations:     . The above documentation has been reviewed and is accurate and complete , DO       Note: This dictation was prepared with Dragon dictation along with smaller phrase technology. Any transcriptional errors that result from this process are unintentional.

## 2019-09-24 NOTE — Assessment & Plan Note (Signed)
Lateral epicondylitis with improvement after the injection.  Patient still is not 100%.  Discussed the possibility of PRP discussed icing regimen and home exercise, discussed avoiding certain activities such as repetitive aspect.  Patient was wearing a brace with tennis and actually discontinued because of did not think it was making a difference.  Follow-up with me again in 2 months

## 2019-09-24 NOTE — Patient Instructions (Addendum)
Good to see you Continue with plan Overall making progress Ok to not wear brace with tennis Ask [facility] to run billing again  See me again in 2 months if not perfect

## 2019-09-30 ENCOUNTER — Other Ambulatory Visit: Payer: Self-pay | Admitting: Family Medicine

## 2019-11-19 DIAGNOSIS — J069 Acute upper respiratory infection, unspecified: Secondary | ICD-10-CM | POA: Diagnosis not present

## 2019-11-19 DIAGNOSIS — Z2089 Contact with and (suspected) exposure to other communicable diseases: Secondary | ICD-10-CM | POA: Diagnosis not present

## 2019-11-19 DIAGNOSIS — J310 Chronic rhinitis: Secondary | ICD-10-CM | POA: Diagnosis not present

## 2019-11-22 DIAGNOSIS — Z2089 Contact with and (suspected) exposure to other communicable diseases: Secondary | ICD-10-CM | POA: Diagnosis not present

## 2019-11-27 ENCOUNTER — Ambulatory Visit: Payer: BC Managed Care – PPO | Admitting: Family Medicine

## 2020-08-05 NOTE — Progress Notes (Signed)
[name] [address] [place] [address] Phone: [phone] Subjective:   Bruce Donath, am serving as a scribe for Dr. Antoine Primas. This visit occurred during the SARS-CoV-2 public health emergency.  Safety protocols were in place, including screening questions prior to the visit, additional usage of staff PPE, and extensive cleaning of exam room while observing appropriate contact time as indicated for disinfecting solutions.   I'm seeing this patient by the request  of:  Jarrett Soho, PA-C  CC: Left chest wall pain  [id number]  Amy Golden is a 40 y.o. female coming in with complaint of pec strain, 1 month ago. Last seen in May 2021 for L elbow pain. Patient states that she moved a table that was really heavy and also got tangled up in shirt. Pain is below clavicle and into the L armpit. Pain is worse when she sits still.  Patient states at night just feels like there is significant tightness noted.  Patient has been able to workout.  Denies any significant association with working out specifically.  Patient also denies any true chest pain, no radiation down the arm.  Patient denies any masses that she has felt.  No family history of breast cancer.  Patient denies any true chest pain.      No past medical history on file. Past Surgical History:  Procedure Laterality Date  . LAPAROSCOPIC APPENDECTOMY N/A 03/01/2014   Procedure: APPENDECTOMY LAPAROSCOPIC;  Surgeon: Glenna Fellows, MD;  Location: [facility];  Service: General;  Laterality: N/A;  . WRIST SURGERY     Social History   Socioeconomic History  . Marital status: Single    Spouse name: Not on file  . Number of children: Not on file  . Years of education: Not on file  . Highest education level: Not on file  Occupational History  . Not on file  Tobacco Use  . Smoking status: Light Tobacco Smoker    Types: Cigarettes  . Smokeless tobacco: Not on file  Substance and  Sexual Activity  . Alcohol use: Yes    Comment: occasional  . Drug use: No  . Sexual activity: Yes  Other Topics Concern  . Not on file  Social History Narrative  . Not on file   Social Determinants of Health   Financial Resource Strain: Not on file  Food Insecurity: Not on file  Transportation Needs: Not on file  Physical Activity: Not on file  Stress: Not on file  Social Connections: Not on file   Allergies  Allergen Reactions  . Codeine Hives  . Penicillins Hives and Swelling  . Wellbutrin [Bupropion] Other (See Comments)    Swelling Chest  . Amoxicillin Itching and Rash   No family history on file.    Current Outpatient Medications (Respiratory):  .  fexofenadine (ALLEGRA) 180 MG tablet, Take 180 mg by mouth daily. Marland Kitchen  triamcinolone (NASACORT) 55 MCG/ACT AERO nasal inhaler, Place 2 sprays into both nostrils daily.  Current Outpatient Medications (Analgesics):  .  acetaminophen (TYLENOL) 325 MG tablet, Take 2 tablets (650 mg total) by mouth every 6 (six) hours as needed for mild pain (or Temp > 100). .  meloxicam (MOBIC) 7.5 MG tablet, Take 1 tablet (7.5 mg total) by mouth daily. Marland Kitchen  oxyCODONE (OXY IR/ROXICODONE) 5 MG immediate release tablet, Take 1-2 tablets (5-10 mg total) by mouth every 6 (six) hours as needed for moderate pain, severe pain or breakthrough pain. .  traMADol (ULTRAM) 50 MG  tablet, Take 1-2 tablets (50-100 mg total) by mouth every 6 (six) hours as needed for moderate pain or severe pain.   Current Outpatient Medications (Other):  Marland Kitchen  Ascorbic Acid (VITAMIN C PO), Take 1,000 mg by mouth daily. Marland Kitchen  b complex vitamins tablet, Take 1 tablet by mouth daily. Marland Kitchen  BIOTIN PO, Take 10,000 mg by mouth daily. Marland Kitchen  docusate sodium (COLACE) 100 MG capsule, Take 1 capsule (100 mg total) by mouth 2 (two) times daily as needed for mild constipation or moderate constipation. .  gabapentin (NEURONTIN) 100 MG capsule, Take 2 capsules (200 mg total) by mouth at bedtime. Marland Kitchen   MAGNESIUM PO, Take 1 tablet by mouth daily. .  Vitamin D, Ergocalciferol, (DRISDOL) 1.25 MG (50000 UNIT) CAPS capsule, TAKE 1 CAPSULE (50,000 UNITS TOTAL) BY MOUTH EVERY 7 (SEVEN) DAYS. Marland Kitchen  Vitamins A & D (VITAMIN A & D) [address] UNITS TABS, Take 1 tablet by mouth daily.   Reviewed prior external information including notes and imaging from  primary care provider As well as notes that were available from care everywhere and other healthcare systems.  Past medical history, social, surgical and family history all reviewed in electronic medical record.  No pertanent information unless stated regarding to the chief complaint.   Review of Systems:  No headache, visual changes, nausea, vomiting, diarrhea, constipation, dizziness, abdominal pain, skin rash, fevers, chills, night sweats, weight loss, swollen lymph nodes, body aches, joint swelling, chest pain, shortness of breath, mood changes. POSITIVE muscle aches  Objective  Blood pressure 120/82, pulse 85, height 5\' 5"  (1.651 m), weight 166 lb (75.3 kg), SpO2 99 %.   General: No apparent distress alert and oriented x3 mood and affect normal, dressed appropriately.  HEENT: Pupils equal, extraocular movements intact  Respiratory: Patient's speak in full sentences and does not appear short of breath  Cardiovascular: No lower extremity edema, non tender, no erythema  Gait normal with good balance and coordination.  MSK: Left shoulder exam shows the patient has very mild positive impingement.  Patient does have good strength of the pack but does have increasing discomfort noted around the SI joint and inferior to the area around the second and third ribs.  With a chaperone in the room breast exam did not show any true masses appreciated.  Patient does have very mild left knee noted in the axilla area.  Patient states that she has had that for quite some time though.  [id number]; 15 additional minutes spent for Therapeutic exercises as stated in above  notes.  This included exercises focusing on stretching, strengthening, with significant focus on eccentric aspects.   Long term goals include an improvement in range of motion, strength, endurance as well as avoiding reinjury. Patient's frequency would include in 1-2 times a day, 3-5 times a week for a duration of 6-12 weeks. Shoulder Exercises that included:  Basic scapular stabilization to include adduction and depression of scapula Scaption, focusing on proper movement and good control Internal and External rotation utilizing a theraband, with elbow tucked at side entire time Rows with theraband   Proper technique shown and discussed handout in great detail with ATC.  All questions were discussed and answered.      Impression and Recommendations:     The above documentation has been reviewed and is accurate and complete , DO

## 2020-08-06 ENCOUNTER — Ambulatory Visit (INDEPENDENT_AMBULATORY_CARE_PROVIDER_SITE_OTHER): Payer: BC Managed Care – PPO | Admitting: Family Medicine

## 2020-08-06 ENCOUNTER — Other Ambulatory Visit: Payer: Self-pay

## 2020-08-06 ENCOUNTER — Encounter: Payer: Self-pay | Admitting: Family Medicine

## 2020-08-06 ENCOUNTER — Ambulatory Visit (INDEPENDENT_AMBULATORY_CARE_PROVIDER_SITE_OTHER): Payer: BC Managed Care – PPO

## 2020-08-06 VITALS — BP 120/82 | HR 85 | Ht 65.0 in | Wt 166.0 lb

## 2020-08-06 DIAGNOSIS — R079 Chest pain, unspecified: Secondary | ICD-10-CM | POA: Diagnosis not present

## 2020-08-06 DIAGNOSIS — M25512 Pain in left shoulder: Secondary | ICD-10-CM | POA: Diagnosis not present

## 2020-08-06 DIAGNOSIS — S29011A Strain of muscle and tendon of front wall of thorax, initial encounter: Secondary | ICD-10-CM | POA: Diagnosis not present

## 2020-08-06 MED ORDER — MELOXICAM 7.5 MG PO TABS: 7.5000 mg | ORAL_TABLET | Freq: Every day | ORAL | 0 refills | Status: DC

## 2020-08-06 NOTE — Patient Instructions (Signed)
Exercises  Xray today shoulder and chest Meloxicam 7.5mg  daily for 10 days then as needed See me again in 4 weeks

## 2020-08-06 NOTE — Assessment & Plan Note (Addendum)
Seems to be more a strain, no masses appreciated, no inflammation of the [place] joint patient shoulder does have good range of motion.  Patient has more pain when she puts pressure on the area.  It does seem to go into more of the radiation from the [facility] joint into the glenoid area.  We will get x-rays to make sure there is no acute fracture.  Encourage patient to monitor certain range of motion.  Given a short course of anti-inflammatories.  Patient will see if she can find any other movement that does seem to exacerbate it.  Follow-up with me again in 4 weeks  We did discuss potentially a mammogram.  Patient states that she is set up for one next month.  We did attempt some mild osteopathic manipulation.  No significant movement.  Did discuss with patient that any increase in shortness of breath, any increase in stress or anxiety that patient is to seek medical attention immediately.

## 2020-08-11 ENCOUNTER — Ambulatory Visit: Payer: BC Managed Care – PPO | Admitting: Family Medicine

## 2020-08-11 DIAGNOSIS — Z0000 Encounter for general adult medical examination without abnormal findings: Secondary | ICD-10-CM | POA: Diagnosis not present

## 2020-08-11 DIAGNOSIS — Z13220 Encounter for screening for lipoid disorders: Secondary | ICD-10-CM | POA: Diagnosis not present

## 2020-08-11 DIAGNOSIS — Z23 Encounter for immunization: Secondary | ICD-10-CM | POA: Diagnosis not present

## 2020-08-19 DIAGNOSIS — Z1151 Encounter for screening for human papillomavirus (HPV): Secondary | ICD-10-CM | POA: Diagnosis not present

## 2020-08-19 DIAGNOSIS — Z01419 Encounter for gynecological examination (general) (routine) without abnormal findings: Secondary | ICD-10-CM | POA: Diagnosis not present

## 2020-08-19 DIAGNOSIS — Z124 Encounter for screening for malignant neoplasm of cervix: Secondary | ICD-10-CM | POA: Diagnosis not present

## 2020-08-19 DIAGNOSIS — Z6826 Body mass index (BMI) 26.0-26.9, adult: Secondary | ICD-10-CM | POA: Diagnosis not present

## 2020-08-26 ENCOUNTER — Other Ambulatory Visit: Payer: Self-pay | Admitting: Obstetrics and Gynecology

## 2020-08-26 DIAGNOSIS — N6320 Unspecified lump in the left breast, unspecified quadrant: Secondary | ICD-10-CM

## 2020-08-26 DIAGNOSIS — N644 Mastodynia: Secondary | ICD-10-CM

## 2020-08-27 ENCOUNTER — Other Ambulatory Visit: Payer: Self-pay

## 2020-08-27 ENCOUNTER — Ambulatory Visit
Admission: RE | Admit: 2020-08-27 | Discharge: 2020-08-27 | Disposition: A | Discharge status: Home / Self Care | Payer: BC Managed Care – PPO | Source: Ambulatory Visit | Attending: Obstetrics and Gynecology | Admitting: Obstetrics and Gynecology

## 2020-08-27 DIAGNOSIS — N644 Mastodynia: Secondary | ICD-10-CM

## 2020-08-27 DIAGNOSIS — R922 Inconclusive mammogram: Secondary | ICD-10-CM | POA: Diagnosis not present

## 2020-08-27 DIAGNOSIS — N6320 Unspecified lump in the left breast, unspecified quadrant: Secondary | ICD-10-CM

## 2020-09-02 NOTE — Progress Notes (Signed)
[name] [address] [place] [address] Phone: [phone] Subjective:   I Ronelle Nigh am serving as a Neurosurgeon for Dr. Antoine Primas.  This visit occurred during the SARS-CoV-2 public health emergency.  Safety protocols were in place, including screening questions prior to the visit, additional usage of staff PPE, and extensive cleaning of exam room while observing appropriate contact time as indicated for disinfecting solutions.   I'm seeing this patient by the request  of:  Jarrett Soho, PA-C  CC: Shoulder pain and pectoral pain follow-up  [id number]   08/06/2020 Seems to be more a strain, no masses appreciated, no inflammation of the [place] joint patient shoulder does have good range of motion.  Patient has more pain when she puts pressure on the area.  It does seem to go into more of the radiation from the [facility] joint into the glenoid area.  We will get x-rays to make sure there is no acute fracture.  Encourage patient to monitor certain range of motion.  Given a short course of anti-inflammatories.  Patient will see if she can find any other movement that does seem to exacerbate it.  Follow-up with me again in 4 weeks  We did discuss potentially a mammogram.  Patient states that she is set up for one next month.  We did attempt some mild osteopathic manipulation.  No significant movement.  Did discuss with patient that any increase in shortness of breath, any increase in stress or anxiety that patient is to seek medical attention immediately.  Update 09/03/2020 VENICE MARCUCCI is a 40 y.o. female coming in with complaint of L pec pain. Patient states Stopped meloxicam after 2 weeks due to costochondritis going away. Still having left pec and clavicle pain. Sleeping is still a problem and certain movements.  Patient states that certain movements significant difficulty.  Patient did have a mammogram as well as an ultrasound that was unremarkable for  any type of mass.  Patient states that the meloxicam helped but did cause more discomfort more with some reflux for the pectoral pain.  Patient is still wondering what is coming going on at this time.  Patient does not understand why this is not improved completely.       No past medical history on file. Past Surgical History:  Procedure Laterality Date  . LAPAROSCOPIC APPENDECTOMY N/A 03/01/2014   Procedure: APPENDECTOMY LAPAROSCOPIC;  Surgeon: Glenna Fellows, MD;  Location: [facility];  Service: General;  Laterality: N/A;  . WRIST SURGERY     Social History   Socioeconomic History  . Marital status: Single    Spouse name: Not on file  . Number of children: Not on file  . Years of education: Not on file  . Highest education level: Not on file  Occupational History  . Not on file  Tobacco Use  . Smoking status: Light Tobacco Smoker    Types: Cigarettes  . Smokeless tobacco: Not on file  Substance and Sexual Activity  . Alcohol use: Yes    Comment: occasional  . Drug use: No  . Sexual activity: Yes  Other Topics Concern  . Not on file  Social History Narrative  . Not on file   Social Determinants of Health   Financial Resource Strain: Not on file  Food Insecurity: Not on file  Transportation Needs: Not on file  Physical Activity: Not on file  Stress: Not on file  Social Connections: Not on file   Allergies  Allergen Reactions  . Codeine Hives  . Penicillins Hives and Swelling  . Wellbutrin [Bupropion] Other (See Comments)    Swelling Chest  . Amoxicillin Itching and Rash   No family history on file.    Current Outpatient Medications (Respiratory):  .  fexofenadine (ALLEGRA) 180 MG tablet, Take 180 mg by mouth daily. Marland Kitchen  triamcinolone (NASACORT) 55 MCG/ACT AERO nasal inhaler, Place 2 sprays into both nostrils daily.  Current Outpatient Medications (Analgesics):  .  acetaminophen (TYLENOL) 325 MG tablet, Take 2 tablets (650 mg total) by mouth every 6 (six)  hours as needed for mild pain (or Temp > 100). .  meloxicam (MOBIC) 7.5 MG tablet, Take 1 tablet (7.5 mg total) by mouth daily. Marland Kitchen  oxyCODONE (OXY IR/ROXICODONE) 5 MG immediate release tablet, Take 1-2 tablets (5-10 mg total) by mouth every 6 (six) hours as needed for moderate pain, severe pain or breakthrough pain. .  traMADol (ULTRAM) 50 MG tablet, Take 1-2 tablets (50-100 mg total) by mouth every 6 (six) hours as needed for moderate pain or severe pain.   Current Outpatient Medications (Other):  Marland Kitchen  Ascorbic Acid (VITAMIN C PO), Take 1,000 mg by mouth daily. Marland Kitchen  b complex vitamins tablet, Take 1 tablet by mouth daily. Marland Kitchen  BIOTIN PO, Take 10,000 mg by mouth daily. Marland Kitchen  docusate sodium (COLACE) 100 MG capsule, Take 1 capsule (100 mg total) by mouth 2 (two) times daily as needed for mild constipation or moderate constipation. .  gabapentin (NEURONTIN) 100 MG capsule, Take 2 capsules (200 mg total) by mouth at bedtime. Marland Kitchen  MAGNESIUM PO, Take 1 tablet by mouth daily. .  Vitamin D, Ergocalciferol, (DRISDOL) 1.25 MG (50000 UNIT) CAPS capsule, TAKE 1 CAPSULE (50,000 UNITS TOTAL) BY MOUTH EVERY 7 (SEVEN) DAYS. Marland Kitchen  Vitamins A & D (VITAMIN A & D) [address] UNITS TABS, Take 1 tablet by mouth daily.   Reviewed prior external information including notes and imaging from  primary care provider As well as notes that were available from care everywhere and other healthcare systems.  Past medical history, social, surgical and family history all reviewed in electronic medical record.  No pertanent information unless stated regarding to the chief complaint.   Review of Systems:  No headache, visual changes, nausea, vomiting, diarrhea, constipation, dizziness, abdominal pain, skin rash, fevers, chills, night sweats, weight loss, swollen lymph nodes, body aches, joint swelling, chest pain, shortness of breath, mood changes. POSITIVE muscle aches  Objective  Blood pressure 100/78, pulse 65, height 5\' 5"  (1.651 m),  weight 158 lb (71.7 kg), SpO2 98 %.   General: No apparent distress alert and oriented x3 mood and affect normal, dressed appropriately.  HEENT: Pupils equal, extraocular movements intact  Respiratory: Patient's speak in full sentences and does not appear short of breath  Cardiovascular: No lower extremity edema, non tender, no erythema  Gait normal with good balance and coordination.  MSK: Left shoulder exam does have fairly good range of motion.  Tightness noted in the left scapular region.  Patient is still tender to palpation in the left [place] joint and still just inferior to the area.  Patient has good range of motion.  No audible popping noted.  Patient does have pain in the pectoral area though with resisted adduction.  Osteopathic findings C4 flexed rotated and side bent left T4 extended rotated and side bent left with inhaled rib L5 flexed rotated and side bent left Sacrum right on right   Impression and Recommendations:  The above documentation has been reviewed and is accurate and complete Lyndal Pulley, DO

## 2020-09-03 ENCOUNTER — Encounter: Payer: Self-pay | Admitting: Family Medicine

## 2020-09-03 ENCOUNTER — Ambulatory Visit (INDEPENDENT_AMBULATORY_CARE_PROVIDER_SITE_OTHER): Payer: BC Managed Care – PPO | Admitting: Family Medicine

## 2020-09-03 ENCOUNTER — Other Ambulatory Visit: Payer: Self-pay

## 2020-09-03 VITALS — BP 100/78 | HR 65 | Ht 65.0 in | Wt 158.0 lb

## 2020-09-03 DIAGNOSIS — S29011D Strain of muscle and tendon of front wall of thorax, subsequent encounter: Secondary | ICD-10-CM

## 2020-09-03 DIAGNOSIS — M9902 Segmental and somatic dysfunction of thoracic region: Secondary | ICD-10-CM | POA: Diagnosis not present

## 2020-09-03 DIAGNOSIS — M9901 Segmental and somatic dysfunction of cervical region: Secondary | ICD-10-CM | POA: Diagnosis not present

## 2020-09-03 DIAGNOSIS — M9903 Segmental and somatic dysfunction of lumbar region: Secondary | ICD-10-CM | POA: Diagnosis not present

## 2020-09-03 DIAGNOSIS — M9908 Segmental and somatic dysfunction of rib cage: Secondary | ICD-10-CM | POA: Diagnosis not present

## 2020-09-03 DIAGNOSIS — M9904 Segmental and somatic dysfunction of sacral region: Secondary | ICD-10-CM | POA: Diagnosis not present

## 2020-09-03 NOTE — Patient Instructions (Addendum)
Good to see you Prilosec daily for 2 weeks over the counter Restart exercises Attempted manipulation and had a good response If worsening pain in the long run consider MRI See me again in 4-6 weeks

## 2020-09-03 NOTE — Assessment & Plan Note (Addendum)
Patient still has signs and symptoms that is consistent with more of a pectoralis muscle injury.  We discussed though the differential does include gastroesophageal reflux disease.  Patient is not having any significant cardiac issues.  Able to workout with no trouble.  Seems to be afterwards when patient has more the discomfort and pain.  Patient's [place] pain is improved a little bit from previous exam.  Attempted osteopathic manipulation with patient having improvement with chiropractic care previously.  We discussed that with patient that if this continues to give her trouble going on another 6 weeks I would encourage her to consider the possibility of an MRI.  Patient will follow up with me again in 6 weeks Did discuss with patient that with any chest pain, shortness of breath, any worsening pain to seek medical attention immediately.

## 2020-09-03 NOTE — Assessment & Plan Note (Signed)
   Decision today to treat with OMT was based on Physical Exam  After verbal consent patient was treated with HVLA, ME, FPR techniques in cervical, thoracic, rib, lumbar and sacral areas,  Patient tolerated the procedure well with improvement in symptoms  Patient given exercises, stretches and lifestyle modifications  See medications in patient instructions if given  Patient will follow up in 6 weeks

## 2020-09-30 ENCOUNTER — Other Ambulatory Visit: Payer: BC Managed Care – PPO

## 2020-10-08 NOTE — Progress Notes (Deleted)
  [name] [address] [place] [address] Phone: [phone] Subjective:    I'm seeing this patient by the request  of:  Jarrett Soho, PA-C  CC:   [id number]  Amy Golden is a 39 y.o. female coming in with complaint of back and neck pain. OMT 09/03/2020. Patient states   Medications patient has been prescribed: Meloxicam  Taking:         Reviewed prior external information including notes and imaging from previsou exam, outside providers and external EMR if available.   As well as notes that were available from care everywhere and other healthcare systems.  Past medical history, social, surgical and family history all reviewed in electronic medical record.  No pertanent information unless stated regarding to the chief complaint.   No past medical history on file.  Allergies  Allergen Reactions  . Codeine Hives  . Penicillins Hives and Swelling  . Wellbutrin [Bupropion] Other (See Comments)    Swelling Chest  . Amoxicillin Itching and Rash     Review of Systems:  No headache, visual changes, nausea, vomiting, diarrhea, constipation, dizziness, abdominal pain, skin rash, fevers, chills, night sweats, weight loss, swollen lymph nodes, body aches, joint swelling, chest pain, shortness of breath, mood changes. POSITIVE muscle aches  Objective  There were no vitals taken for this visit.   General: No apparent distress alert and oriented x3 mood and affect normal, dressed appropriately.  HEENT: Pupils equal, extraocular movements intact  Respiratory: Patient's speak in full sentences and does not appear short of breath  Cardiovascular: No lower extremity edema, non tender, no erythema  Neuro: Cranial nerves II through XII are intact, neurovascularly intact in all extremities with 2+ DTRs and 2+ pulses.  Gait normal with good balance and coordination.  MSK:  Non tender with full range of motion and good stability and  symmetric strength and tone of shoulders, elbows, wrist, hip, knee and ankles bilaterally.  Back - Normal skin, Spine with normal alignment and no deformity.  No tenderness to vertebral process palpation.  Paraspinous muscles are not tender and without spasm.   Range of motion is full at neck and lumbar sacral regions  Osteopathic findings  C2 flexed rotated and side bent right C6 flexed rotated and side bent left T3 extended rotated and side bent right inhaled rib T9 extended rotated and side bent left L2 flexed rotated and side bent right Sacrum right on right       Assessment and Plan:    Nonallopathic problems  Decision today to treat with OMT was based on Physical Exam  After verbal consent patient was treated with HVLA, ME, FPR techniques in cervical, rib, thoracic, lumbar, and sacral  areas  Patient tolerated the procedure well with improvement in symptoms  Patient given exercises, stretches and lifestyle modifications  See medications in patient instructions if given  Patient will follow up in 4-8 weeks      The above documentation has been reviewed and is accurate and complete Wilford Grist       Note: This dictation was prepared with Dragon dictation along with smaller phrase technology. Any transcriptional errors that result from this process are unintentional.

## 2020-10-09 ENCOUNTER — Ambulatory Visit: Payer: BC Managed Care – PPO | Admitting: Family Medicine

## 2020-11-18 DIAGNOSIS — Z8616 Personal history of COVID-19: Secondary | ICD-10-CM | POA: Diagnosis not present

## 2020-11-18 DIAGNOSIS — R002 Palpitations: Secondary | ICD-10-CM | POA: Diagnosis not present

## 2020-11-19 NOTE — Progress Notes (Signed)
  [name] [address] [place] [address] Phone: [phone] Subjective:   Amy Golden, am serving as a scribe for Dr. Antoine Primas.  I'm seeing this patient by the request  of:  Jarrett Soho, PA-C  CC: Left chest wall pain and arm pain  [id number]  Amy Golden is a 40 y.o. female coming in with complaint of back and neck pain. OMT 09/03/2020.  Patient states that she is having a lot of pain that is causing her to not be able to get comfortable or sleep at night. Pain starts by L clavicle and shoots down L  arm. No weakness or numbness or tingling in L upper extremity.  Patient still states that she notices the pain when she sits down.  Patient denies that any weakness.  States that it can wake her up at night.  States that it does affect daily activities sometimes.  Patient is frustrated because she has not been able to increase her activity at all at the moment.  Medications patient has been prescribed: Meloxicam   Taking: none          History reviewed. No pertinent past medical history.  Allergies  Allergen Reactions   Codeine Hives   Penicillins Hives and Swelling   Wellbutrin [Bupropion] Other (See Comments)    Swelling Chest   Amoxicillin Itching and Rash     Review of Systems:  No headache, visual changes, nausea, vomiting, diarrhea, constipation, dizziness, abdominal pain, skin rash, fevers, chills, night sweats, weight loss, swollen lymph nodes, body aches, joint swelling, chest pain, shortness of breath, mood changes. POSITIVE muscle aches  Objective  Blood pressure 108/70, pulse 81, height 5\' 5"  (1.651 m), weight 161 lb (73 kg), SpO2 98 %.   General: Patient is somewhat anxious, tearful HEENT: Pupils equal, extraocular movements intact  Respiratory: Patient's speak in full sentences and does not appear short of breath  Cardiovascular: No lower extremity edema, non tender, no erythema  Patient's left  chest wall patient is tender to palpation of the AC joint.  No swelling noted at this point.  Patient still has pain approximately midline on the left side of the chest wall around the T2-T3 area.  Questionable pectoralis muscle spasm in the area. Patient's left shoulder has good range of motion.  No significant worsening of pain or radicular symptoms with compression of the axillary area.  No pain over the acromioclavicular joint.  Patient does have tightness with internal rotation but more pain at the [facility] joint.  Mild discomfort with restricted adduction of the arm.  Limited musculoskeletal ultrasound was performed and interpreted by [facility]  Limited ultrasound of patient's chest wall and SI joints seems to be unremarkable with no significant hypoechoic changes and unfortunately no significant masses appreciated.  No cortical irregularity of the second or third rib on the anterior aspect. Impression: No significant findings on ultrasound of the chest wall.       Assessment and Plan:      The above documentation has been reviewed and is accurate and complete Judi Saa, DO        Note: This dictation was prepared with Dragon dictation along with smaller phrase technology. Any transcriptional errors that result from this process are unintentional.

## 2020-11-28 ENCOUNTER — Encounter: Payer: Self-pay | Admitting: Family Medicine

## 2020-11-28 ENCOUNTER — Other Ambulatory Visit: Payer: Self-pay

## 2020-11-28 ENCOUNTER — Ambulatory Visit: Payer: Self-pay

## 2020-11-28 ENCOUNTER — Ambulatory Visit (INDEPENDENT_AMBULATORY_CARE_PROVIDER_SITE_OTHER): Payer: BC Managed Care – PPO | Admitting: Family Medicine

## 2020-11-28 VITALS — BP 108/70 | HR 81 | Ht 65.0 in | Wt 161.0 lb

## 2020-11-28 DIAGNOSIS — M25512 Pain in left shoulder: Secondary | ICD-10-CM

## 2020-11-28 DIAGNOSIS — R0782 Intercostal pain: Secondary | ICD-10-CM

## 2020-11-28 DIAGNOSIS — S29011D Strain of muscle and tendon of front wall of thorax, subsequent encounter: Secondary | ICD-10-CM

## 2020-11-28 NOTE — Assessment & Plan Note (Signed)
Patient is continuing to have pain.  Seems to have progressed more to the [facility] joint.  The patient feels like it has not made any significant improvement since last time.  Patient is frustrated because she is unable to actually increase activity and affecting all daily activities including sleeping.  Patient feels that anti-inflammatories only helps occasionally.  Patient would like to have an answer.  Patient did have a ultrasound of the area that was unremarkable previously and today do not see any significant findings that would be consistent.  Patient has had a mammogram as well that was unremarkable.  Due to the severity of the pain over the patient I feel advanced imaging is warranted.  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  We will get CT scan to further evaluate and then MRI of the chest as well as for more of the musculature.

## 2020-11-28 NOTE — Patient Instructions (Addendum)
Good to see you  CT and MRI chest Stay active  Write [place] if any changes  If CT normal we will go through with MRI See me again in 6 weeks just in case

## 2020-11-30 ENCOUNTER — Encounter: Payer: Self-pay | Admitting: Family Medicine

## 2020-12-03 DIAGNOSIS — R079 Chest pain, unspecified: Secondary | ICD-10-CM | POA: Diagnosis not present

## 2020-12-03 DIAGNOSIS — R0789 Other chest pain: Secondary | ICD-10-CM | POA: Diagnosis not present

## 2020-12-03 DIAGNOSIS — R42 Dizziness and giddiness: Secondary | ICD-10-CM | POA: Diagnosis not present

## 2020-12-03 DIAGNOSIS — R000 Tachycardia, unspecified: Secondary | ICD-10-CM | POA: Diagnosis not present

## 2020-12-08 DIAGNOSIS — F411 Generalized anxiety disorder: Secondary | ICD-10-CM | POA: Diagnosis not present

## 2020-12-17 ENCOUNTER — Ambulatory Visit
Admission: RE | Admit: 2020-12-17 | Discharge: 2020-12-17 | Disposition: A | Discharge status: Home / Self Care | Payer: BC Managed Care – PPO | Source: Ambulatory Visit | Attending: Family Medicine | Admitting: Family Medicine

## 2020-12-17 DIAGNOSIS — R079 Chest pain, unspecified: Secondary | ICD-10-CM | POA: Diagnosis not present

## 2020-12-17 DIAGNOSIS — R0782 Intercostal pain: Secondary | ICD-10-CM

## 2020-12-21 ENCOUNTER — Encounter: Payer: Self-pay | Admitting: Family Medicine

## 2020-12-24 ENCOUNTER — Other Ambulatory Visit: Payer: Self-pay | Admitting: *Deleted

## 2020-12-24 ENCOUNTER — Other Ambulatory Visit: Payer: Self-pay

## 2020-12-24 DIAGNOSIS — R932 Abnormal findings on diagnostic imaging of liver and biliary tract: Secondary | ICD-10-CM

## 2020-12-24 DIAGNOSIS — S29001A Unspecified injury of muscle and tendon of front wall of thorax, initial encounter: Secondary | ICD-10-CM

## 2020-12-24 NOTE — Progress Notes (Signed)
MRI ordered

## 2021-01-08 DIAGNOSIS — F411 Generalized anxiety disorder: Secondary | ICD-10-CM | POA: Diagnosis not present

## 2021-01-09 ENCOUNTER — Ambulatory Visit: Payer: BC Managed Care – PPO | Admitting: Family Medicine

## 2021-01-20 ENCOUNTER — Other Ambulatory Visit: Payer: BC Managed Care – PPO

## 2021-01-22 NOTE — Progress Notes (Deleted)
[name] [address] [place] [address] Phone: [phone] Subjective:    I'm seeing this patient by the request  of:  Jarrett Soho, PA-C  CC:   [id number]  11/28/2020 Patient is continuing to have pain.  Seems to have progressed more to the [facility] joint.  The patient feels like it has not made any significant improvement since last time.  Patient is frustrated because she is unable to actually increase activity and affecting all daily activities including sleeping.  Patient feels that anti-inflammatories only helps occasionally.  Patient would like to have an answer.  Patient did have a ultrasound of the area that was unremarkable previously and today do not see any significant findings that would be consistent.  Patient has had a mammogram as well that was unremarkable.  Due to the severity of the pain over the patient I feel advanced imaging is warranted.  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  We will get CT scan to further evaluate and then MRI of the chest as well as for more of the musculature.  Updated 01/27/2021 Amy Golden is a 40 y.o. female coming in with complaint of chest pain.***  CT IMPRESSION: 12/17/2020 1. No acute findings in the chest. Specifically, no findings to explain the patient's history of chest pain. 2. Sequelae of prior granulomatous disease. 3. Several small hypodensities in the liver. These cannot be definitively characterized on today's noncontrast study but are likely benign. MRI abdomen with and without contrast could be used to further evaluate as clinically warranted.  Onset-  Location Duration-  Character- Aggravating factors- Reliving factors-  Therapies tried-  Severity-     No past medical history on file. Past Surgical History:  Procedure Laterality Date   LAPAROSCOPIC APPENDECTOMY N/A 03/01/2014   Procedure: APPENDECTOMY LAPAROSCOPIC;  Surgeon: Glenna Fellows, MD;  Location: [facility];  Service: General;  Laterality: N/A;   WRIST SURGERY     Social History   Socioeconomic History   Marital status: Single    Spouse name: Not on file   Number of children: Not on file   Years of education: Not on file   Highest education level: Not on file  Occupational History   Not on file  Tobacco Use   Smoking status: Light Smoker    Types: Cigarettes   Smokeless tobacco: Not on file  Substance and Sexual Activity   Alcohol use: Yes    Comment: occasional   Drug use: No   Sexual activity: Yes  Other Topics Concern   Not on file  Social History Narrative   Not on file   Social Determinants of Health   Financial Resource Strain: Not on file  Food Insecurity: Not on file  Transportation Needs: Not on file  Physical Activity: Not on file  Stress: Not on file  Social Connections: Not on file   Allergies  Allergen Reactions   Codeine Hives   Penicillins Hives and Swelling   Wellbutrin [Bupropion] Other (See Comments)    Swelling Chest   Amoxicillin Itching and Rash   No family history on file.    Current Outpatient Medications (Respiratory):    fexofenadine (ALLEGRA) 180 MG tablet, Take 180 mg by mouth daily.   triamcinolone (NASACORT) 55 MCG/ACT AERO nasal inhaler, Place 2 sprays into both nostrils daily.  Current Outpatient Medications (Analgesics):    acetaminophen (TYLENOL) 325 MG tablet, Take 2 tablets (650 mg total) by mouth every 6 (six) hours as  needed for mild pain (or Temp > 100).   oxyCODONE (OXY IR/ROXICODONE) 5 MG immediate release tablet, Take 1-2 tablets (5-10 mg total) by mouth every 6 (six) hours as needed for moderate pain, severe pain or breakthrough pain. (Patient not taking: Reported on 11/28/2020)   traMADol (ULTRAM) 50 MG tablet, Take 1-2 tablets (50-100 mg total) by mouth every 6 (six) hours as needed for moderate pain or severe pain. (Patient not taking: Reported on 11/28/2020)   Current Outpatient Medications (Other):    Ascorbic  Acid (VITAMIN C PO), Take 1,000 mg by mouth daily.   b complex vitamins tablet, Take 1 tablet by mouth daily.   BIOTIN PO, Take 10,000 mg by mouth daily.   docusate sodium (COLACE) 100 MG capsule, Take 1 capsule (100 mg total) by mouth 2 (two) times daily as needed for mild constipation or moderate constipation.   gabapentin (NEURONTIN) 100 MG capsule, Take 2 capsules (200 mg total) by mouth at bedtime. (Patient not taking: Reported on 11/28/2020)   MAGNESIUM PO, Take 1 tablet by mouth daily.   Vitamin D, Ergocalciferol, (DRISDOL) 1.25 MG (50000 UNIT) CAPS capsule, TAKE 1 CAPSULE (50,000 UNITS TOTAL) BY MOUTH EVERY 7 (SEVEN) DAYS.   Vitamins A & D (VITAMIN A & D) [address] UNITS TABS, Take 1 tablet by mouth daily.   Reviewed prior external information including notes and imaging from  primary care provider As well as notes that were available from care everywhere and other healthcare systems.  Past medical history, social, surgical and family history all reviewed in electronic medical record.  No pertanent information unless stated regarding to the chief complaint.   Review of Systems:  No headache, visual changes, nausea, vomiting, diarrhea, constipation, dizziness, abdominal pain, skin rash, fevers, chills, night sweats, weight loss, swollen lymph nodes, body aches, joint swelling, chest pain, shortness of breath, mood changes. POSITIVE muscle aches  Objective  There were no vitals taken for this visit.   General: No apparent distress alert and oriented x3 mood and affect normal, dressed appropriately.  HEENT: Pupils equal, extraocular movements intact  Respiratory: Patient's speak in full sentences and does not appear short of breath  Cardiovascular: No lower extremity edema, non tender, no erythema  Gait normal with good balance and coordination.  MSK:  Non tender with full range of motion and good stability and symmetric strength and tone of shoulders, elbows, wrist, hip, knee and  ankles bilaterally.     Impression and Recommendations:     The above documentation has been reviewed and is accurate and complete Doristine Bosworth

## 2021-01-27 ENCOUNTER — Ambulatory Visit: Payer: BC Managed Care – PPO | Admitting: Family Medicine

## 2021-07-09 DIAGNOSIS — S0502XA Injury of conjunctiva and corneal abrasion without foreign body, left eye, initial encounter: Secondary | ICD-10-CM | POA: Diagnosis not present

## 2021-07-10 DIAGNOSIS — S0502XD Injury of conjunctiva and corneal abrasion without foreign body, left eye, subsequent encounter: Secondary | ICD-10-CM | POA: Diagnosis not present

## 2021-07-13 DIAGNOSIS — S0502XD Injury of conjunctiva and corneal abrasion without foreign body, left eye, subsequent encounter: Secondary | ICD-10-CM | POA: Diagnosis not present

## 2021-08-20 DIAGNOSIS — Z01419 Encounter for gynecological examination (general) (routine) without abnormal findings: Secondary | ICD-10-CM | POA: Diagnosis not present

## 2021-08-20 DIAGNOSIS — Z1231 Encounter for screening mammogram for malignant neoplasm of breast: Secondary | ICD-10-CM | POA: Diagnosis not present

## 2021-08-20 DIAGNOSIS — Z1389 Encounter for screening for other disorder: Secondary | ICD-10-CM | POA: Diagnosis not present

## 2021-08-28 DIAGNOSIS — Z23 Encounter for immunization: Secondary | ICD-10-CM | POA: Diagnosis not present

## 2021-09-09 DIAGNOSIS — R928 Other abnormal and inconclusive findings on diagnostic imaging of breast: Secondary | ICD-10-CM | POA: Diagnosis not present

## 2021-09-09 DIAGNOSIS — R922 Inconclusive mammogram: Secondary | ICD-10-CM | POA: Diagnosis not present

## 2021-10-12 DIAGNOSIS — H16141 Punctate keratitis, right eye: Secondary | ICD-10-CM | POA: Diagnosis not present

## 2021-10-15 DIAGNOSIS — H16141 Punctate keratitis, right eye: Secondary | ICD-10-CM | POA: Diagnosis not present

## 2021-10-19 DIAGNOSIS — H16141 Punctate keratitis, right eye: Secondary | ICD-10-CM | POA: Diagnosis not present

## 2021-11-05 DIAGNOSIS — Z23 Encounter for immunization: Secondary | ICD-10-CM | POA: Diagnosis not present

## 2021-12-03 IMAGING — CT CT CHEST W/O CM
3 of 7 series · 16 of 36 positions shown, 18 images · non-contrast
Comparison: None.

CLINICAL DATA: Chest wall pain.

EXAM:
CT CHEST WITHOUT CONTRAST
TECHNIQUE: Multidetector CT imaging of the chest was performed following the
standard protocol without IV contrast.

[Series 2: chest 2.00 br40 s3 · axial · 0.69mm/px · z∈[+1431,+1683]mm · 8 of 164 slices shown, 10 images (1 of 2)]
[im 19/164  mediastinal]
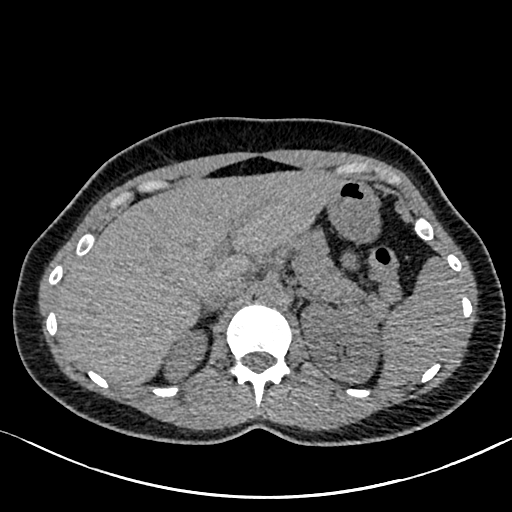
[im 19/164  lung]
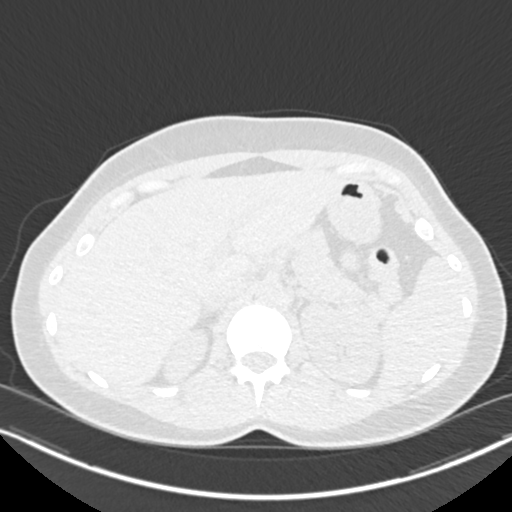
[im 37/164  lung]
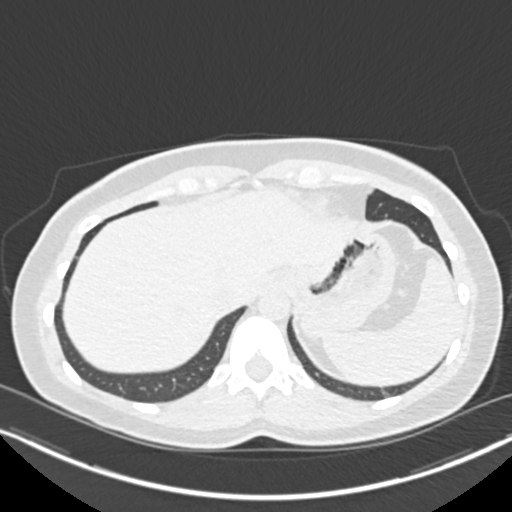
[im 55/164  lung]
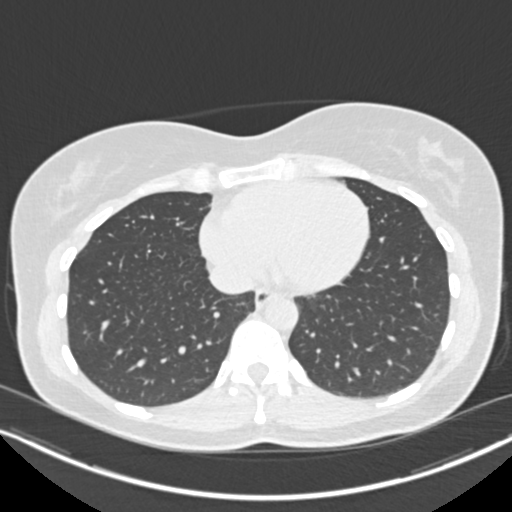
[im 73/164  lung]
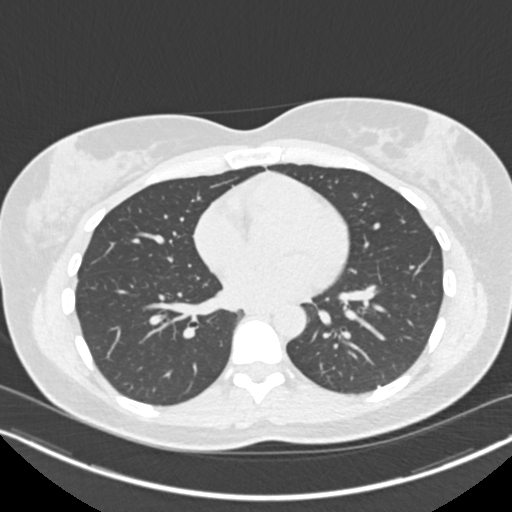
[im 91/164  mediastinal]
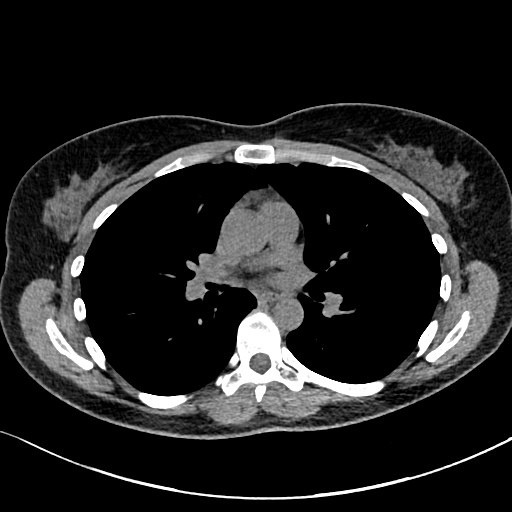
[im 91/164  lung]
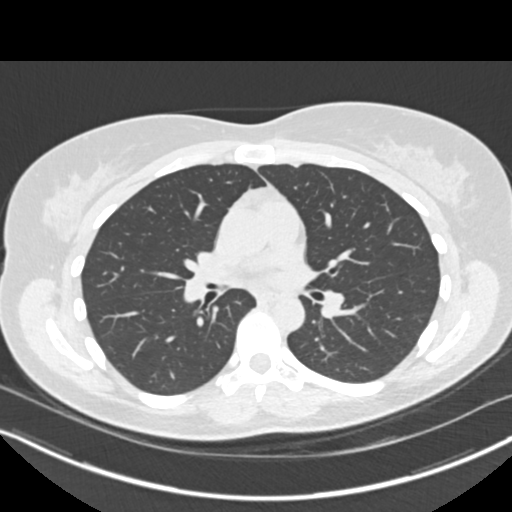
[im 109/164  lung]
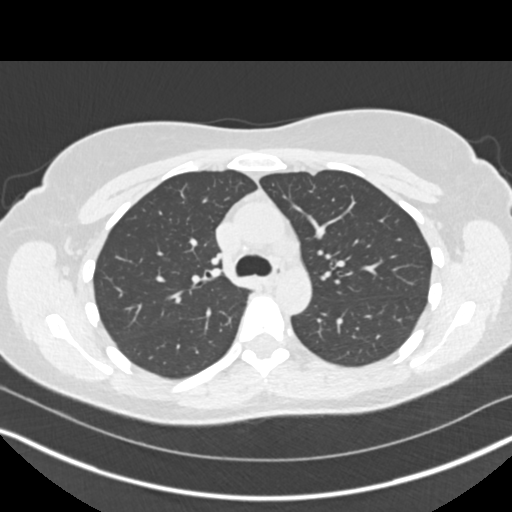
[im 127/164  lung]
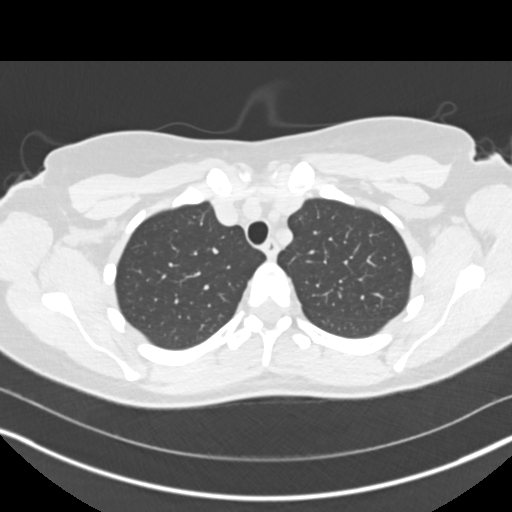
[im 145/164  lung]
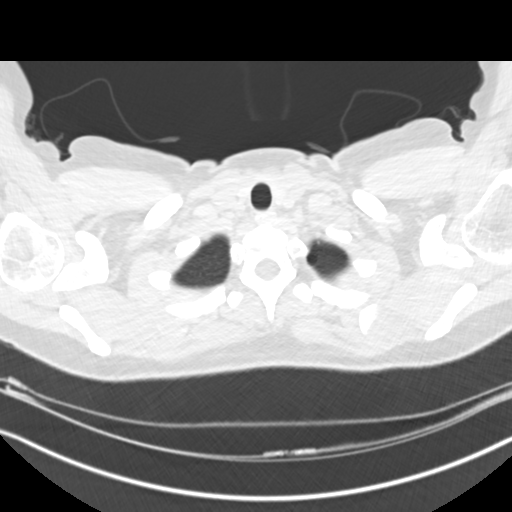

[Series 4: chest 2.00 br40 s3 · coronal · 0.64mm/px · 1 of 177 slices shown (2 of 2)]
[im 89/177  lung]
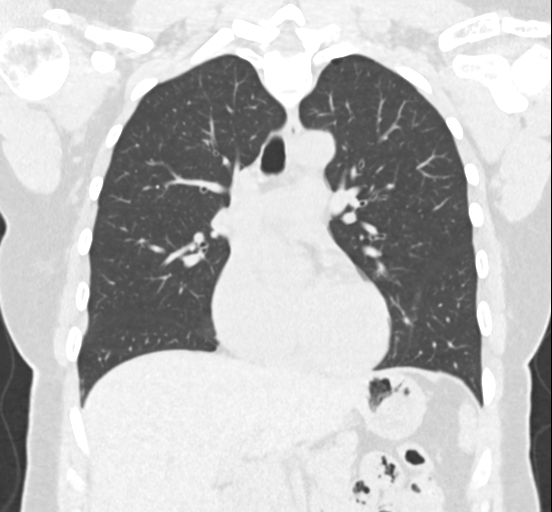

[Series 8: chest 2.00 br60 s3 bone · axial · 0.69mm/px · z∈[+1435,+1679]mm · 7 of 164 slices shown]
[im 21/164  lung]
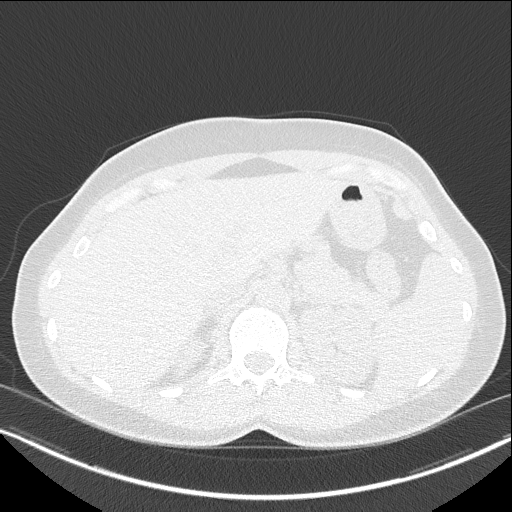
[im 41/164  lung]
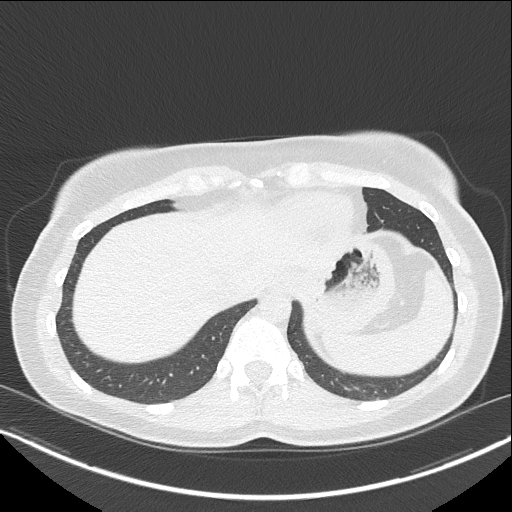
[im 62/164  lung]
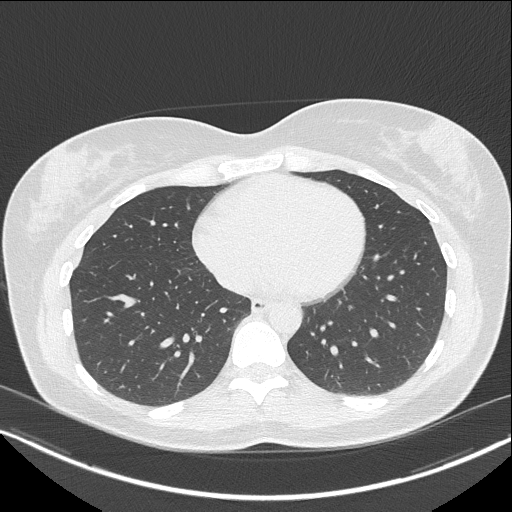
[im 82/164  lung]
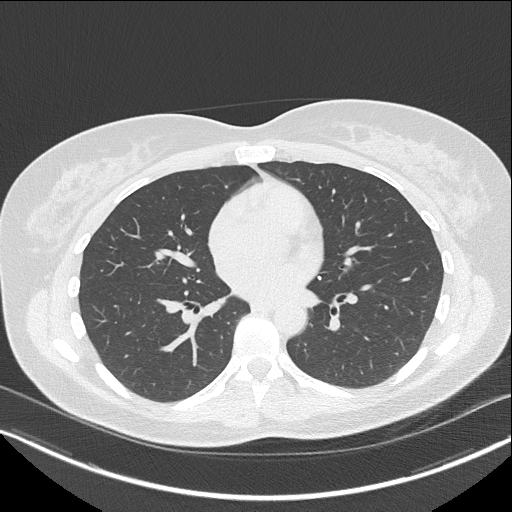
[im 102/164  lung]
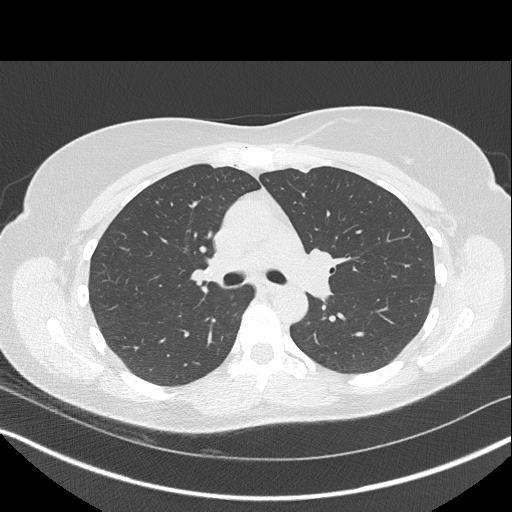
[im 123/164  lung]
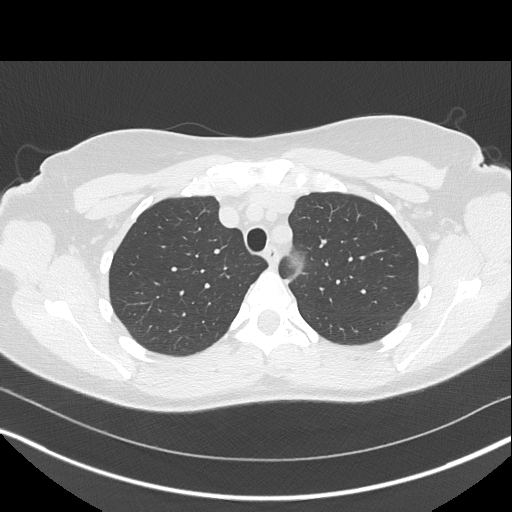
[im 143/164  lung]
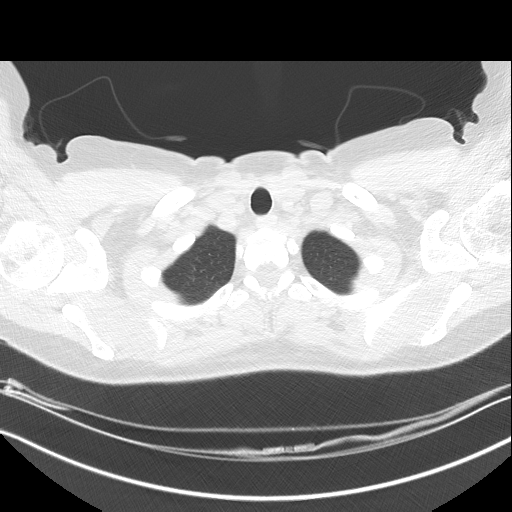

[16 of 36 positions shown; findings below may reference images not displayed]

FINDINGS: Cardiovascular: The heart size is normal. No substantial pericardial
effusion.

Mediastinum/Nodes: Calcified nodal tissue identified in the
mediastinum and right hilum. The esophagus has normal imaging
features. There is no axillary lymphadenopathy.

Lungs/Pleura: Scattered calcified granulomata noted bilaterally. No
suspicious pulmonary nodule or mass. No focal airspace
consolidation. No pleural effusion.

Upper Abdomen: Small hypodensities in the liver cannot be
definitively characterized but are likely benign.

Musculoskeletal: No worrisome lytic or sclerotic osseous
abnormality.
IMPRESSION: 1. No acute findings in the chest. Specifically, no findings to
explain the patient's history of chest pain.
2. Sequelae of prior granulomatous disease.
3. Several small hypodensities in the liver. These cannot be
definitively characterized on today's noncontrast study but are
likely benign. MRI abdomen with and without contrast could be used
to further evaluate as clinically warranted.

## 2022-03-05 DIAGNOSIS — Z23 Encounter for immunization: Secondary | ICD-10-CM | POA: Diagnosis not present

## 2022-03-31 NOTE — Progress Notes (Signed)
[facility] [address] [place] [address] Phone: [phone]   Assessment and Plan:    1. Neck pain 2. Chronic bilateral thoracic back pain 3. Somatic dysfunction of cervical region 4. Somatic dysfunction of thoracic region 5. Somatic dysfunction of lumbar region 6. Somatic dysfunction of pelvic region 7. Somatic dysfunction of rib region  -Chronic with exacerbation, initial sports medicine visit - Recurrence of multiple musculoskeletal complaints likely flared from sleeping in childhood bed over Thanksgiving.  Most prominent pains in neck, upper back - Patient has received significant relief with OMT in the past.  Elects for repeat OMT today.  Tolerated well per note below.  Patient is weary of high velocity techniques, so low velocity and low amplitude techniques were used throughout treatment - Decision today to treat with OMT was based on Physical Exam   After verbal consent patient was treated with LVLA (low velocity low amplitude), ME (muscle energy), FPR (flex positional release), ST (soft tissue), PC/PD (Pelvic Compression/ Pelvic Decompression) techniques in cervical, rib, thoracic, lumbar, and pelvic areas. Patient tolerated the procedure well with improvement in symptoms.  Patient educated on potential side effects of soreness and recommended to rest, hydrate, and use Tylenol as needed for pain control.   Pertinent previous records reviewed include none   Follow Up: 6 weeks for reevaluation.  Could repeat OMT if patient found it beneficial   Subjective:   I, Amy Golden, am serving as a Neurosurgeon for Doctor Richardean Sale  Chief Complaint: MSK   HPI:  11/28/2021 Amy Golden is a 41 y.o. female coming in with complaint of back and neck pain. OMT 09/03/2020.  Patient states that she is having a lot of pain that is causing her to not be able to get comfortable or sleep at night. Pain starts by L clavicle and shoots down L  arm.  No weakness or numbness or tingling in L upper extremity.  Patient still states that she notices the pain when she sits down.  Patient denies that any weakness.  States that it can wake her up at night.  States that it does affect daily activities sometimes.  Patient is frustrated because she has not been able to increase her activity at all at the moment.   04/05/2022 Patient states that she needs an adjustment for her back   Relevant Historical Information: None pertinent  Additional pertinent review of systems negative.  Current Outpatient Medications  Medication Sig Dispense Refill   BIOTIN PO Take 10,000 mg by mouth daily.     fexofenadine (ALLEGRA) 180 MG tablet Take 180 mg by mouth daily.     acetaminophen (TYLENOL) 325 MG tablet Take 2 tablets (650 mg total) by mouth every 6 (six) hours as needed for mild pain (or Temp > 100).     Ascorbic Acid (VITAMIN C PO) Take 1,000 mg by mouth daily.     b complex vitamins tablet Take 1 tablet by mouth daily.     docusate sodium (COLACE) 100 MG capsule Take 1 capsule (100 mg total) by mouth 2 (two) times daily as needed for mild constipation or moderate constipation. 10 capsule 0   gabapentin (NEURONTIN) 100 MG capsule Take 2 capsules (200 mg total) by mouth at bedtime. (Patient not taking: Reported on 11/28/2020) 180 capsule 3   MAGNESIUM PO Take 1 tablet by mouth daily.     oxyCODONE (OXY IR/ROXICODONE) 5 MG immediate release tablet Take 1-2 tablets (5-10 mg total)  by mouth every 6 (six) hours as needed for moderate pain, severe pain or breakthrough pain. (Patient not taking: Reported on 11/28/2020) 40 tablet 0   traMADol (ULTRAM) 50 MG tablet Take 1-2 tablets (50-100 mg total) by mouth every 6 (six) hours as needed for moderate pain or severe pain. (Patient not taking: Reported on 11/28/2020) 40 tablet 0   triamcinolone (NASACORT) 55 MCG/ACT AERO nasal inhaler Place 2 sprays into both nostrils daily.     Vitamin D, Ergocalciferol, (DRISDOL) 1.25 MG  (50000 UNIT) CAPS capsule TAKE 1 CAPSULE (50,000 UNITS TOTAL) BY MOUTH EVERY 7 (SEVEN) DAYS. 12 capsule 0   Vitamins A & D (VITAMIN A & D) [address] UNITS TABS Take 1 tablet by mouth daily.     No current facility-administered medications for this visit.      Objective:     Vitals:   04/05/22 1329  BP: 110/78  Pulse: 73  SpO2: 97%  Weight: 157 lb (71.2 kg)  Height: 5\' 5"  (1.651 m)      Body mass index is 26.13 kg/m.    Physical Exam:     General: Well-appearing, cooperative, sitting comfortably in no acute distress.   OMT Physical Exam:  ASIS Compression Test: Positive left Cervical: TTP paraspinal, C3 RLSR, C4-6 RRSR Rib: Bilateral elevated first rib with mild TTP Thoracic: TTP paraspinal, T6-9 RRSL Lumbar: TTP paraspinal, L1-3 R LSR Pelvis: Left posterior innominate  Electronically signed by:  [facility] 2:23 PM 04/05/22

## 2022-04-05 ENCOUNTER — Ambulatory Visit (INDEPENDENT_AMBULATORY_CARE_PROVIDER_SITE_OTHER): Payer: BC Managed Care – PPO | Admitting: Sports Medicine

## 2022-04-05 VITALS — BP 110/78 | HR 73 | Ht 65.0 in | Wt 157.0 lb

## 2022-04-05 DIAGNOSIS — M542 Cervicalgia: Secondary | ICD-10-CM

## 2022-04-05 DIAGNOSIS — M9908 Segmental and somatic dysfunction of rib cage: Secondary | ICD-10-CM

## 2022-04-05 DIAGNOSIS — M9903 Segmental and somatic dysfunction of lumbar region: Secondary | ICD-10-CM | POA: Diagnosis not present

## 2022-04-05 DIAGNOSIS — M9901 Segmental and somatic dysfunction of cervical region: Secondary | ICD-10-CM | POA: Diagnosis not present

## 2022-04-05 DIAGNOSIS — G8929 Other chronic pain: Secondary | ICD-10-CM

## 2022-04-05 DIAGNOSIS — M9905 Segmental and somatic dysfunction of pelvic region: Secondary | ICD-10-CM | POA: Diagnosis not present

## 2022-04-05 DIAGNOSIS — M546 Pain in thoracic spine: Secondary | ICD-10-CM

## 2022-04-05 DIAGNOSIS — M9902 Segmental and somatic dysfunction of thoracic region: Secondary | ICD-10-CM

## 2022-04-05 NOTE — Patient Instructions (Addendum)
[facility]  [phone] 6 week follow up MSK

## 2022-04-06 DIAGNOSIS — Z23 Encounter for immunization: Secondary | ICD-10-CM | POA: Diagnosis not present

## 2022-04-23 DIAGNOSIS — R42 Dizziness and giddiness: Secondary | ICD-10-CM | POA: Diagnosis not present

## 2022-04-23 DIAGNOSIS — R110 Nausea: Secondary | ICD-10-CM | POA: Diagnosis not present

## 2022-04-23 DIAGNOSIS — R0981 Nasal congestion: Secondary | ICD-10-CM | POA: Diagnosis not present

## 2022-04-27 DIAGNOSIS — H6993 Unspecified Eustachian tube disorder, bilateral: Secondary | ICD-10-CM | POA: Diagnosis not present

## 2022-04-27 DIAGNOSIS — J0100 Acute maxillary sinusitis, unspecified: Secondary | ICD-10-CM | POA: Diagnosis not present

## 2022-05-14 NOTE — Progress Notes (Signed)
[facility] D[facility] [address] [place] [address] Phone: [phone]   Assessment and Plan:     1. Neck pain 2. Chronic bilateral thoracic back pain 3. Somatic dysfunction of cervical region 4. Somatic dysfunction of thoracic region 5. Somatic dysfunction of lumbar region 6. Somatic dysfunction of pelvic region 7. Somatic dysfunction of rib region -Chronic with exacerbation, subsequent visit - Overall moderate improvement in multiple musculoskeletal complaints after OMT at previous office visit with patient continuing to experience relief 6 weeks later - May continue Tylenol as needed for day-to-day pain relief - Patient has received significant relief with OMT in the past.  Elects for repeat OMT today.  Tolerated well per note below. - Decision today to treat with OMT was based on Physical Exam   After verbal consent patient was treated with HVLA (high velocity low amplitude), ME (muscle energy), FPR (flex positional release), ST (soft tissue), PC/PD (Pelvic Compression/ Pelvic Decompression) techniques in cervical, rib, thoracic, lumbar, and pelvic areas. Patient tolerated the procedure well with improvement in symptoms.  Patient educated on potential side effects of soreness and recommended to rest, hydrate, and use Tylenol as needed for pain control.   Pertinent previous records reviewed include none   Follow Up: 3 months for reevaluation.  Could consider repeat OMT at that time   Subjective:   I, Amy Golden, am serving as a Neurosurgeon for Doctor Richardean Sale  Chief Complaint: MSK    HPI:  11/28/2021 Amy Golden is a 42 y.o. female coming in with complaint of back and neck pain. OMT 09/03/2020.  Patient states that she is having a lot of pain that is causing her to not be able to get comfortable or sleep at night. Pain starts by L clavicle and shoots down L  arm. No weakness or numbness or tingling in L upper extremity.  Patient still  states that she notices the pain when she sits down.  Patient denies that any weakness.  States that it can wake her up at night.  States that it does affect daily activities sometimes.  Patient is frustrated because she has not been able to increase her activity at all at the moment.    04/05/2022 Patient states that she needs an adjustment for her back   05/17/2022 Patient states she is good , she has been having dizziness has a ear thing going  on    Relevant Historical Information: None pertinent  Additional pertinent review of systems negative.  Current Outpatient Medications  Medication Sig Dispense Refill   BIOTIN PO Take 10,000 mg by mouth daily.     fexofenadine (ALLEGRA) 180 MG tablet Take 180 mg by mouth daily.     acetaminophen (TYLENOL) 325 MG tablet Take 2 tablets (650 mg total) by mouth every 6 (six) hours as needed for mild pain (or Temp > 100).     Ascorbic Acid (VITAMIN C PO) Take 1,000 mg by mouth daily.     b complex vitamins tablet Take 1 tablet by mouth daily.     docusate sodium (COLACE) 100 MG capsule Take 1 capsule (100 mg total) by mouth 2 (two) times daily as needed for mild constipation or moderate constipation. 10 capsule 0   gabapentin (NEURONTIN) 100 MG capsule Take 2 capsules (200 mg total) by mouth at bedtime. (Patient not taking: Reported on 11/28/2020) 180 capsule 3   MAGNESIUM PO Take 1 tablet by mouth daily.     oxyCODONE (OXY  IR/ROXICODONE) 5 MG immediate release tablet Take 1-2 tablets (5-10 mg total) by mouth every 6 (six) hours as needed for moderate pain, severe pain or breakthrough pain. (Patient not taking: Reported on 11/28/2020) 40 tablet 0   traMADol (ULTRAM) 50 MG tablet Take 1-2 tablets (50-100 mg total) by mouth every 6 (six) hours as needed for moderate pain or severe pain. (Patient not taking: Reported on 11/28/2020) 40 tablet 0   triamcinolone (NASACORT) 55 MCG/ACT AERO nasal inhaler Place 2 sprays into both nostrils daily.     Vitamin D,  Ergocalciferol, (DRISDOL) 1.25 MG (50000 UNIT) CAPS capsule TAKE 1 CAPSULE (50,000 UNITS TOTAL) BY MOUTH EVERY 7 (SEVEN) DAYS. 12 capsule 0   Vitamins A & D (VITAMIN A & D) [address] UNITS TABS Take 1 tablet by mouth daily.     No current facility-administered medications for this visit.      Objective:     Vitals:   05/17/22 0808  BP: 110/80  Pulse: 88  SpO2: 97%  Weight: 160 lb (72.6 kg)  Height: 5\' 5"  (1.651 m)      Body mass index is 26.63 kg/m.    Physical Exam:     General: Well-appearing, cooperative, sitting comfortably in no acute distress.   OMT Physical Exam:  ASIS Compression Test: Positive Right Cervical: TTP paraspinal, C2 RL, C5 RR SL Rib: Right elevated first rib with TTP Thoracic: TTP paraspinal, T6 RRSR Lumbar: TTP paraspinal, L2 RLSL Pelvis: Right anterior innominate  Electronically signed by:  Amy [facility] 9:20 AM 05/17/22

## 2022-05-17 ENCOUNTER — Ambulatory Visit (INDEPENDENT_AMBULATORY_CARE_PROVIDER_SITE_OTHER): Payer: BC Managed Care – PPO | Admitting: Sports Medicine

## 2022-05-17 VITALS — BP 110/80 | HR 88 | Ht 65.0 in | Wt 160.0 lb

## 2022-05-17 DIAGNOSIS — M9902 Segmental and somatic dysfunction of thoracic region: Secondary | ICD-10-CM | POA: Diagnosis not present

## 2022-05-17 DIAGNOSIS — M9903 Segmental and somatic dysfunction of lumbar region: Secondary | ICD-10-CM | POA: Diagnosis not present

## 2022-05-17 DIAGNOSIS — M542 Cervicalgia: Secondary | ICD-10-CM

## 2022-05-17 DIAGNOSIS — G8929 Other chronic pain: Secondary | ICD-10-CM

## 2022-05-17 DIAGNOSIS — M9905 Segmental and somatic dysfunction of pelvic region: Secondary | ICD-10-CM

## 2022-05-17 DIAGNOSIS — M9901 Segmental and somatic dysfunction of cervical region: Secondary | ICD-10-CM | POA: Diagnosis not present

## 2022-05-17 DIAGNOSIS — M9908 Segmental and somatic dysfunction of rib cage: Secondary | ICD-10-CM

## 2022-05-17 DIAGNOSIS — M546 Pain in thoracic spine: Secondary | ICD-10-CM | POA: Diagnosis not present

## 2022-05-17 NOTE — Patient Instructions (Signed)
Good to see you   

## 2022-06-08 DIAGNOSIS — Z0000 Encounter for general adult medical examination without abnormal findings: Secondary | ICD-10-CM | POA: Diagnosis not present

## 2022-06-08 DIAGNOSIS — Z13220 Encounter for screening for lipoid disorders: Secondary | ICD-10-CM | POA: Diagnosis not present

## 2022-08-09 NOTE — Progress Notes (Unsigned)
[name] D.[place] Phone: [phone]   Assessment and Plan:     There are no diagnoses linked to this encounter.  *** - Patient has received significant relief with OMT in the past.  Elects for repeat OMT today.  Tolerated well per note below. - Decision today to treat with OMT was based on Physical Exam   After verbal consent patient was treated with HVLA (high velocity low amplitude), ME (muscle energy), FPR (flex positional release), ST (soft tissue), PC/PD (Pelvic Compression/ Pelvic Decompression) techniques in cervical, rib, thoracic, lumbar, and pelvic areas. Patient tolerated the procedure well with improvement in symptoms.  Patient educated on potential side effects of soreness and recommended to rest, hydrate, and use Tylenol as needed for pain control.   Pertinent previous records reviewed include ***   Follow Up: ***     Subjective:   I, Amy Golden, am serving as a Education administrator for Doctor Glennon Mac  Chief Complaint: MSK    HPI:  11/28/2021 Amy Golden is a 42 y.o. female coming in with complaint of back and neck pain. OMT 09/03/2020.  Patient states that she is having a lot of pain that is causing her to not be able to get comfortable or sleep at night. Pain starts by L clavicle and shoots down L  arm. No weakness or numbness or tingling in L upper extremity.  Patient still states that she notices the pain when she sits down.  Patient denies that any weakness.  States that it can wake her up at night.  States that it does affect daily activities sometimes.  Patient is frustrated because she has not been able to increase her activity at all at the moment.    04/05/2022 Patient states that she needs an adjustment for her back    05/17/2022 Patient states she is good , she has been having dizziness has a ear thing going  on    08/10/2022 Patient states   Relevant Historical Information: None  pertinent  Additional pertinent review of systems negative.  Current Outpatient Medications  Medication Sig Dispense Refill   acetaminophen (TYLENOL) 325 MG tablet Take 2 tablets (650 mg total) by mouth every 6 (six) hours as needed for mild pain (or Temp > 100).     Ascorbic Acid (VITAMIN C PO) Take 1,000 mg by mouth daily.     b complex vitamins tablet Take 1 tablet by mouth daily.     BIOTIN PO Take 10,000 mg by mouth daily.     docusate sodium (COLACE) 100 MG capsule Take 1 capsule (100 mg total) by mouth 2 (two) times daily as needed for mild constipation or moderate constipation. 10 capsule 0   fexofenadine (ALLEGRA) 180 MG tablet Take 180 mg by mouth daily.     gabapentin (NEURONTIN) 100 MG capsule Take 2 capsules (200 mg total) by mouth at bedtime. (Patient not taking: Reported on 11/28/2020) 180 capsule 3   MAGNESIUM PO Take 1 tablet by mouth daily.     oxyCODONE (OXY IR/ROXICODONE) 5 MG immediate release tablet Take 1-2 tablets (5-10 mg total) by mouth every 6 (six) hours as needed for moderate pain, severe pain or breakthrough pain. (Patient not taking: Reported on 11/28/2020) 40 tablet 0   traMADol (ULTRAM) 50 MG tablet Take 1-2 tablets (50-100 mg total) by mouth every 6 (six) hours as needed for moderate pain or severe pain. (Patient not taking: Reported on 11/28/2020)  40 tablet 0   triamcinolone (NASACORT) 55 MCG/ACT AERO nasal inhaler Place 2 sprays into both nostrils daily.     Vitamin D, Ergocalciferol, (DRISDOL) 1.25 MG (50000 UNIT) CAPS capsule TAKE 1 CAPSULE (50,000 UNITS TOTAL) BY MOUTH EVERY 7 (SEVEN) DAYS. 12 capsule 0   Vitamins A & D (VITAMIN A & D) [id number] UNITS TABS Take 1 tablet by mouth daily.     No current facility-administered medications for this visit.      Objective:     There were no vitals filed for this visit.    There is no height or weight on file to calculate BMI.    Physical Exam:     General: Well-appearing, cooperative, sitting comfortably in  no acute distress.   OMT Physical Exam:  ASIS Compression Test: Positive Right Cervical: TTP paraspinal, *** Rib: Bilateral elevated first rib with TTP Thoracic: TTP paraspinal,*** Lumbar: TTP paraspinal,*** Pelvis: Right anterior innominate  Electronically signed by:  [name] D.[facility] 7:20 AM 08/09/22

## 2022-08-10 ENCOUNTER — Ambulatory Visit (INDEPENDENT_AMBULATORY_CARE_PROVIDER_SITE_OTHER): Payer: BC Managed Care – PPO | Admitting: Sports Medicine

## 2022-08-10 VITALS — HR 80 | Ht 65.0 in | Wt 160.0 lb

## 2022-08-10 DIAGNOSIS — M9905 Segmental and somatic dysfunction of pelvic region: Secondary | ICD-10-CM

## 2022-08-10 DIAGNOSIS — M546 Pain in thoracic spine: Secondary | ICD-10-CM | POA: Diagnosis not present

## 2022-08-10 DIAGNOSIS — M9903 Segmental and somatic dysfunction of lumbar region: Secondary | ICD-10-CM

## 2022-08-10 DIAGNOSIS — M9901 Segmental and somatic dysfunction of cervical region: Secondary | ICD-10-CM | POA: Diagnosis not present

## 2022-08-10 DIAGNOSIS — M9908 Segmental and somatic dysfunction of rib cage: Secondary | ICD-10-CM

## 2022-08-10 DIAGNOSIS — M9902 Segmental and somatic dysfunction of thoracic region: Secondary | ICD-10-CM | POA: Diagnosis not present

## 2022-08-10 DIAGNOSIS — G8929 Other chronic pain: Secondary | ICD-10-CM

## 2022-08-10 DIAGNOSIS — M542 Cervicalgia: Secondary | ICD-10-CM

## 2022-08-10 NOTE — Patient Instructions (Signed)
Good to see you   

## 2022-08-25 DIAGNOSIS — Z01419 Encounter for gynecological examination (general) (routine) without abnormal findings: Secondary | ICD-10-CM | POA: Diagnosis not present

## 2022-08-25 DIAGNOSIS — Z130 Encounter for screening for diseases of the blood and blood-forming organs and certain disorders involving the immune mechanism: Secondary | ICD-10-CM | POA: Diagnosis not present

## 2022-08-25 DIAGNOSIS — G43909 Migraine, unspecified, not intractable, without status migrainosus: Secondary | ICD-10-CM | POA: Diagnosis not present

## 2022-08-25 DIAGNOSIS — Z1231 Encounter for screening mammogram for malignant neoplasm of breast: Secondary | ICD-10-CM | POA: Diagnosis not present

## 2022-08-25 DIAGNOSIS — Z1389 Encounter for screening for other disorder: Secondary | ICD-10-CM | POA: Diagnosis not present

## 2022-09-02 DIAGNOSIS — R42 Dizziness and giddiness: Secondary | ICD-10-CM | POA: Diagnosis not present

## 2022-09-02 DIAGNOSIS — H93293 Other abnormal auditory perceptions, bilateral: Secondary | ICD-10-CM | POA: Diagnosis not present

## 2022-10-11 DIAGNOSIS — H18831 Recurrent erosion of cornea, right eye: Secondary | ICD-10-CM | POA: Diagnosis not present

## 2022-10-19 DIAGNOSIS — H18831 Recurrent erosion of cornea, right eye: Secondary | ICD-10-CM | POA: Diagnosis not present

## 2022-10-26 ENCOUNTER — Ambulatory Visit: Payer: BC Managed Care – PPO | Admitting: Sports Medicine

## 2022-10-26 DIAGNOSIS — R42 Dizziness and giddiness: Secondary | ICD-10-CM | POA: Diagnosis not present

## 2022-10-26 NOTE — Progress Notes (Unsigned)
Amy Golden [facility] [address] [place] [address] Phone: [phone]   Assessment and Plan:     There are no diagnoses linked to this encounter.  *** - Patient has received relief with OMT in the past.  Elects for repeat OMT today.  Tolerated well per note below. - Decision today to treat with OMT was based on Physical Exam   After verbal consent patient was treated with HVLA (high velocity low amplitude), ME (muscle energy), FPR (flex positional release), ST (soft tissue), PC/PD (Pelvic Compression/ Pelvic Decompression) techniques in cervical, rib, thoracic, lumbar, and pelvic areas. Patient tolerated the procedure well with improvement in symptoms.  Patient educated on potential side effects of soreness and recommended to rest, hydrate, and use Tylenol as needed for pain control.   Pertinent previous records reviewed include ***   Follow Up: ***     Subjective:   I, Amy Golden, am serving as a Neurosurgeon for Doctor Richardean Sale  Chief Complaint: MSK    HPI:  11/28/2021 Amy Golden is a 42 y.o. female coming in with complaint of back and neck pain. OMT 09/03/2020.  Patient states that she is having a lot of pain that is causing her to not be able to get comfortable or sleep at night. Pain starts by L clavicle and shoots down L  arm. No weakness or numbness or tingling in L upper extremity.  Patient still states that she notices the pain when she sits down.  Patient denies that any weakness.  States that it can wake her up at night.  States that it does affect daily activities sometimes.  Patient is frustrated because she has not been able to increase her activity at all at the moment.    04/05/2022 Patient states that she needs an adjustment for her back    05/17/2022 Patient states she is good , she has been having dizziness has a ear thing going  on    08/10/2022 Patient states that she is good ,here for an adjustment ,pain in right is  coming back but she had a CSI 3 years ago maybe   10/27/2022 Patient states    Relevant Historical Information: None pertinent Additional pertinent review of systems negative.  Current Outpatient Medications  Medication Sig Dispense Refill   acetaminophen (TYLENOL) 325 MG tablet Take 2 tablets (650 mg total) by mouth every 6 (six) hours as needed for mild pain (or Temp > 100).     Ascorbic Acid (VITAMIN C PO) Take 1,000 mg by mouth daily.     b complex vitamins tablet Take 1 tablet by mouth daily.     BIOTIN PO Take 10,000 mg by mouth daily.     docusate sodium (COLACE) 100 MG capsule Take 1 capsule (100 mg total) by mouth 2 (two) times daily as needed for mild constipation or moderate constipation. 10 capsule 0   fexofenadine (ALLEGRA) 180 MG tablet Take 180 mg by mouth daily.     gabapentin (NEURONTIN) 100 MG capsule Take 2 capsules (200 mg total) by mouth at bedtime. (Patient not taking: Reported on 11/28/2020) 180 capsule 3   MAGNESIUM PO Take 1 tablet by mouth daily.     oxyCODONE (OXY IR/ROXICODONE) 5 MG immediate release tablet Take 1-2 tablets (5-10 mg total) by mouth every 6 (six) hours as needed for moderate pain, severe pain or breakthrough pain. (Patient not taking: Reported on 11/28/2020) 40 tablet 0   traMADol (ULTRAM) 50 MG  tablet Take 1-2 tablets (50-100 mg total) by mouth every 6 (six) hours as needed for moderate pain or severe pain. (Patient not taking: Reported on 11/28/2020) 40 tablet 0   triamcinolone (NASACORT) 55 MCG/ACT AERO nasal inhaler Place 2 sprays into both nostrils daily.     Vitamin D, Ergocalciferol, (DRISDOL) 1.25 MG (50000 UNIT) CAPS capsule TAKE 1 CAPSULE (50,000 UNITS TOTAL) BY MOUTH EVERY 7 (SEVEN) DAYS. 12 capsule 0   Vitamins A & D (VITAMIN A & D) [address] UNITS TABS Take 1 tablet by mouth daily.     No current facility-administered medications for this visit.      Objective:     There were no vitals filed for this visit.    There is no height or  weight on file to calculate BMI.    Physical Exam:     General: Well-appearing, cooperative, sitting comfortably in no acute distress.   OMT Physical Exam:  ASIS Compression Test: Positive Right Cervical: TTP paraspinal, *** Rib: Bilateral elevated first rib with TTP Thoracic: TTP paraspinal,*** Lumbar: TTP paraspinal,*** Pelvis: Right anterior innominate  Electronically signed by:  Amy [facility] 8:24 AM 10/26/22

## 2022-10-27 ENCOUNTER — Ambulatory Visit (INDEPENDENT_AMBULATORY_CARE_PROVIDER_SITE_OTHER): Payer: BC Managed Care – PPO | Admitting: Sports Medicine

## 2022-10-27 VITALS — BP 120/80 | HR 76 | Ht 65.0 in | Wt 160.0 lb

## 2022-10-27 DIAGNOSIS — G8929 Other chronic pain: Secondary | ICD-10-CM

## 2022-10-27 DIAGNOSIS — M9902 Segmental and somatic dysfunction of thoracic region: Secondary | ICD-10-CM

## 2022-10-27 DIAGNOSIS — M542 Cervicalgia: Secondary | ICD-10-CM

## 2022-10-27 DIAGNOSIS — M9903 Segmental and somatic dysfunction of lumbar region: Secondary | ICD-10-CM

## 2022-10-27 DIAGNOSIS — M546 Pain in thoracic spine: Secondary | ICD-10-CM

## 2022-10-27 DIAGNOSIS — M25561 Pain in right knee: Secondary | ICD-10-CM

## 2022-10-27 DIAGNOSIS — M9901 Segmental and somatic dysfunction of cervical region: Secondary | ICD-10-CM | POA: Diagnosis not present

## 2022-10-27 DIAGNOSIS — M9908 Segmental and somatic dysfunction of rib cage: Secondary | ICD-10-CM

## 2022-10-27 DIAGNOSIS — M25562 Pain in left knee: Secondary | ICD-10-CM

## 2022-10-27 DIAGNOSIS — M9905 Segmental and somatic dysfunction of pelvic region: Secondary | ICD-10-CM

## 2022-10-27 NOTE — Patient Instructions (Signed)
Knee HEP  2-3 month follow up

## 2022-11-02 DIAGNOSIS — H9042 Sensorineural hearing loss, unilateral, left ear, with unrestricted hearing on the contralateral side: Secondary | ICD-10-CM | POA: Diagnosis not present

## 2022-11-02 DIAGNOSIS — R42 Dizziness and giddiness: Secondary | ICD-10-CM | POA: Diagnosis not present

## 2023-01-17 NOTE — Progress Notes (Unsigned)
Aleen [facility]ells [facility] [address] [place] [address] Phone: [phone]   Assessment and Plan:     There are no diagnoses linked to this encounter.  ***   Pertinent previous records reviewed include ***   Follow Up: ***     Subjective:   I, Amy Golden, am serving as a Neurosurgeon for Doctor Richardean Sale  Chief Complaint: MSK    HPI:  11/28/2021 Amy Golden is a 42 y.o. female coming in with complaint of back and neck pain. OMT 09/03/2020.  Patient states that she is having a lot of pain that is causing her to not be able to get comfortable or sleep at night. Pain starts by L clavicle and shoots down L  arm. No weakness or numbness or tingling in L upper extremity.  Patient still states that she notices the pain when she sits down.  Patient denies that any weakness.  States that it can wake her up at night.  States that it does affect daily activities sometimes.  Patient is frustrated because she has not been able to increase her activity at all at the moment.    04/05/2022 Patient states that she needs an adjustment for her back    05/17/2022 Patient states she is good , she has been having dizziness has a ear thing going  on    08/10/2022 Patient states that she is good ,here for an adjustment ,pain in right is coming back but she had a CSI 3 years ago maybe    10/27/2022 Patient states that she is good just an adjustment    01/18/2023 Patient states    Relevant Historical Information: None pertinent  Additional pertinent review of systems negative.   Current Outpatient Medications:    acetaminophen (TYLENOL) 325 MG tablet, Take 2 tablets (650 mg total) by mouth every 6 (six) hours as needed for mild pain (or Temp > 100)., Disp: , Rfl:    Ascorbic Acid (VITAMIN C PO), Take 1,000 mg by mouth daily., Disp: , Rfl:    b complex vitamins tablet, Take 1 tablet by mouth daily., Disp: , Rfl:    BIOTIN PO, Take 10,000 mg by  mouth daily., Disp: , Rfl:    docusate sodium (COLACE) 100 MG capsule, Take 1 capsule (100 mg total) by mouth 2 (two) times daily as needed for mild constipation or moderate constipation., Disp: 10 capsule, Rfl: 0   fexofenadine (ALLEGRA) 180 MG tablet, Take 180 mg by mouth daily., Disp: , Rfl:    gabapentin (NEURONTIN) 100 MG capsule, Take 2 capsules (200 mg total) by mouth at bedtime. (Patient not taking: Reported on 11/28/2020), Disp: 180 capsule, Rfl: 3   MAGNESIUM PO, Take 1 tablet by mouth daily., Disp: , Rfl:    oxyCODONE (OXY IR/ROXICODONE) 5 MG immediate release tablet, Take 1-2 tablets (5-10 mg total) by mouth every 6 (six) hours as needed for moderate pain, severe pain or breakthrough pain. (Patient not taking: Reported on 11/28/2020), Disp: 40 tablet, Rfl: 0   traMADol (ULTRAM) 50 MG tablet, Take 1-2 tablets (50-100 mg total) by mouth every 6 (six) hours as needed for moderate pain or severe pain. (Patient not taking: Reported on 11/28/2020), Disp: 40 tablet, Rfl: 0   triamcinolone (NASACORT) 55 MCG/ACT AERO nasal inhaler, Place 2 sprays into both nostrils daily., Disp: , Rfl:    Vitamin D, Ergocalciferol, (DRISDOL) 1.25 MG (50000 UNIT) CAPS capsule, TAKE 1 CAPSULE (50,000 UNITS TOTAL) BY MOUTH  EVERY 7 (SEVEN) DAYS., Disp: 12 capsule, Rfl: 0   Vitamins A & D (VITAMIN A & D) [address] UNITS TABS, Take 1 tablet by mouth daily., Disp: , Rfl:    Objective:     There were no vitals filed for this visit.    There is no height or weight on file to calculate BMI.    Physical Exam:    ***   Electronically signed by:  Aleen Sells [facility] 4:15 PM 01/17/23

## 2023-01-18 ENCOUNTER — Ambulatory Visit (INDEPENDENT_AMBULATORY_CARE_PROVIDER_SITE_OTHER): Payer: BC Managed Care – PPO | Admitting: Sports Medicine

## 2023-01-18 VITALS — HR 83 | Ht 65.0 in | Wt 160.0 lb

## 2023-01-18 DIAGNOSIS — M545 Low back pain, unspecified: Secondary | ICD-10-CM

## 2023-01-18 DIAGNOSIS — M9902 Segmental and somatic dysfunction of thoracic region: Secondary | ICD-10-CM

## 2023-01-18 DIAGNOSIS — M9901 Segmental and somatic dysfunction of cervical region: Secondary | ICD-10-CM | POA: Diagnosis not present

## 2023-01-18 DIAGNOSIS — M542 Cervicalgia: Secondary | ICD-10-CM

## 2023-01-18 DIAGNOSIS — G8929 Other chronic pain: Secondary | ICD-10-CM

## 2023-01-18 DIAGNOSIS — M9905 Segmental and somatic dysfunction of pelvic region: Secondary | ICD-10-CM

## 2023-01-18 DIAGNOSIS — M9903 Segmental and somatic dysfunction of lumbar region: Secondary | ICD-10-CM

## 2023-01-18 DIAGNOSIS — M9908 Segmental and somatic dysfunction of rib cage: Secondary | ICD-10-CM

## 2023-01-21 ENCOUNTER — Ambulatory Visit: Payer: BC Managed Care – PPO | Admitting: Sports Medicine

## 2023-03-11 ENCOUNTER — Ambulatory Visit: Payer: BC Managed Care – PPO | Admitting: Sports Medicine

## 2023-03-14 ENCOUNTER — Ambulatory Visit (INDEPENDENT_AMBULATORY_CARE_PROVIDER_SITE_OTHER): Payer: BC Managed Care – PPO | Admitting: Sports Medicine

## 2023-03-14 VITALS — BP 130/80 | HR 97 | Ht 65.0 in | Wt 160.0 lb

## 2023-03-14 DIAGNOSIS — M542 Cervicalgia: Secondary | ICD-10-CM | POA: Diagnosis not present

## 2023-03-14 DIAGNOSIS — M9905 Segmental and somatic dysfunction of pelvic region: Secondary | ICD-10-CM

## 2023-03-14 DIAGNOSIS — M545 Low back pain, unspecified: Secondary | ICD-10-CM

## 2023-03-14 DIAGNOSIS — M9902 Segmental and somatic dysfunction of thoracic region: Secondary | ICD-10-CM | POA: Diagnosis not present

## 2023-03-14 DIAGNOSIS — M9901 Segmental and somatic dysfunction of cervical region: Secondary | ICD-10-CM

## 2023-03-14 DIAGNOSIS — M9903 Segmental and somatic dysfunction of lumbar region: Secondary | ICD-10-CM

## 2023-03-14 DIAGNOSIS — M9908 Segmental and somatic dysfunction of rib cage: Secondary | ICD-10-CM

## 2023-03-14 DIAGNOSIS — G8929 Other chronic pain: Secondary | ICD-10-CM

## 2023-03-14 NOTE — Progress Notes (Signed)
Aleen [facility]ells [facility] [address] [place] [address] Phone: [phone]   Assessment and Plan:     1. Neck pain 2. Chronic bilateral low back pain without sciatica 3. Somatic dysfunction of cervical region 4. Somatic dysfunction of thoracic region 5. Somatic dysfunction of lumbar region 6. Somatic dysfunction of pelvic region 7. Somatic dysfunction of rib region  -Chronic, stable, subsequent visit - Overall improvement in multiple musculoskeletal concerns with regular OMT, and patient being more active with treadmill at home - Patient has received relief with OMT in the past.  Elects for repeat OMT today.  Tolerated well per note below. - Decision today to treat with OMT was based on Physical Exam  After verbal consent patient was treated with HVLA (high velocity low amplitude), ME (muscle energy), FPR (flex positional release), ST (soft tissue), PC/PD (Pelvic Compression/ Pelvic Decompression) techniques in cervical, rib, thoracic, lumbar, and pelvic areas. Patient tolerated the procedure well with improvement in symptoms.  Patient educated on potential side effects of soreness and recommended to rest, hydrate, and use Tylenol as needed for pain control.   Pertinent previous records reviewed include none   Follow Up: 4 to 8 weeks for reevaluation.  Could consider repeat OMT.  Patient mentioned forearm discomfort that has been present off-and-on for years which she described as a cyst or pressure placed on radial nerve near elbow that resolved after CSI in the past.  If symptoms continue, would recommend scheduling follow-up visit for reevaluation of his chronic condition   Subjective:   I, Jerene Canny, am serving as a Neurosurgeon for Doctor Richardean Sale  Chief Complaint: MSK    HPI:  11/28/2021 WYLLOW SEIGLER is a 42 y.o. female coming in with complaint of back and neck pain. OMT 09/03/2020.  Patient states that she is having a lot of  pain that is causing her to not be able to get comfortable or sleep at night. Pain starts by L clavicle and shoots down L  arm. No weakness or numbness or tingling in L upper extremity.  Patient still states that she notices the pain when she sits down.  Patient denies that any weakness.  States that it can wake her up at night.  States that it does affect daily activities sometimes.  Patient is frustrated because she has not been able to increase her activity at all at the moment.    04/05/2022 Patient states that she needs an adjustment for her back    05/17/2022 Patient states she is good , she has been having dizziness has a ear thing going  on    08/10/2022 Patient states that she is good ,here for an adjustment ,pain in right is coming back but she had a CSI 3 years ago maybe    10/27/2022 Patient states that she is good just an adjustment    01/18/2023 Patient states that she is good neck is flared    03/14/2023 Patient states she is good . She is getting some improvement in the neck    Relevant Historical Information: None pertinent  Additional pertinent review of systems negative.   Current Outpatient Medications:    BIOTIN PO, Take 10,000 mg by mouth daily., Disp: , Rfl:    fexofenadine (ALLEGRA) 180 MG tablet, Take 180 mg by mouth daily., Disp: , Rfl:    acetaminophen (TYLENOL) 325 MG tablet, Take 2 tablets (650 mg total) by mouth every 6 (six) hours as needed for mild  pain (or Temp > 100)., Disp: , Rfl:    Ascorbic Acid (VITAMIN C PO), Take 1,000 mg by mouth daily., Disp: , Rfl:    b complex vitamins tablet, Take 1 tablet by mouth daily., Disp: , Rfl:    docusate sodium (COLACE) 100 MG capsule, Take 1 capsule (100 mg total) by mouth 2 (two) times daily as needed for mild constipation or moderate constipation., Disp: 10 capsule, Rfl: 0   gabapentin (NEURONTIN) 100 MG capsule, Take 2 capsules (200 mg total) by mouth at bedtime. (Patient not taking: Reported on 11/28/2020), Disp: 180  capsule, Rfl: 3   MAGNESIUM PO, Take 1 tablet by mouth daily., Disp: , Rfl:    oxyCODONE (OXY IR/ROXICODONE) 5 MG immediate release tablet, Take 1-2 tablets (5-10 mg total) by mouth every 6 (six) hours as needed for moderate pain, severe pain or breakthrough pain. (Patient not taking: Reported on 11/28/2020), Disp: 40 tablet, Rfl: 0   traMADol (ULTRAM) 50 MG tablet, Take 1-2 tablets (50-100 mg total) by mouth every 6 (six) hours as needed for moderate pain or severe pain. (Patient not taking: Reported on 11/28/2020), Disp: 40 tablet, Rfl: 0   triamcinolone (NASACORT) 55 MCG/ACT AERO nasal inhaler, Place 2 sprays into both nostrils daily., Disp: , Rfl:    Vitamin D, Ergocalciferol, (DRISDOL) 1.25 MG (50000 UNIT) CAPS capsule, TAKE 1 CAPSULE (50,000 UNITS TOTAL) BY MOUTH EVERY 7 (SEVEN) DAYS., Disp: 12 capsule, Rfl: 0   Vitamins A & D (VITAMIN A & D) [address] UNITS TABS, Take 1 tablet by mouth daily., Disp: , Rfl:    Objective:     Vitals:   03/14/23 1324  BP: 130/80  Pulse: 97  SpO2: 99%  Weight: 160 lb (72.6 kg)  Height: 5\' 5"  (1.651 m)      Body mass index is 26.63 kg/m.    Physical Exam:    General: Well-appearing, cooperative, sitting comfortably in no acute distress.   OMT Physical Exam:  ASIS Compression Test: Positive Right Cervical: TTP paraspinal, C3-5 RL SL Rib: Bilateral elevated first rib with TTP Thoracic: TTP paraspinal, T4-6 RRSL, T7-9 RLSR Lumbar: TTP paraspinal, L2 RLSL Pelvis: Right anterior innominate    Electronically signed by:  Aleen [facility]ells [facility] 1:49 PM 03/14/23

## 2023-05-02 NOTE — Progress Notes (Deleted)
Amy [facility] [address] [place] [address] Phone: [phone]   Assessment and Plan:     There are no diagnoses linked to this encounter.  ***   Pertinent previous records reviewed include ***    Follow Up: ***     Subjective:   I, Amy Golden, am serving as a Neurosurgeon for Doctor Richardean Sale  Chief Complaint: MSK    HPI:  11/28/2021 Amy Golden is a 42 y.o. female coming in with complaint of back and neck pain. OMT 09/03/2020.  Patient states that she is having a lot of pain that is causing her to not be able to get comfortable or sleep at night. Pain starts by L clavicle and shoots down L  arm. No weakness or numbness or tingling in L upper extremity.  Patient still states that she notices the pain when she sits down.  Patient denies that any weakness.  States that it can wake her up at night.  States that it does affect daily activities sometimes.  Patient is frustrated because she has not been able to increase her activity at all at the moment.    04/05/2022 Patient states that she needs an adjustment for her back    05/17/2022 Patient states she is good , she has been having dizziness has a ear thing going  on    08/10/2022 Patient states that she is good ,here for an adjustment ,pain in right is coming back but she had a CSI 3 years ago maybe    10/27/2022 Patient states that she is good just an adjustment    01/18/2023 Patient states that she is good neck is flared    03/14/2023 Patient states she is good . She is getting some improvement in the neck    05/12/2023 Patient states  Relevant Historical Information: None pertinent    Additional pertinent review of systems negative.   Current Outpatient Medications:    acetaminophen (TYLENOL) 325 MG tablet, Take 2 tablets (650 mg total) by mouth every 6 (six) hours as needed for mild pain (or Temp > 100)., Disp: , Rfl:    Ascorbic Acid (VITAMIN C PO), Take  1,000 mg by mouth daily., Disp: , Rfl:    b complex vitamins tablet, Take 1 tablet by mouth daily., Disp: , Rfl:    BIOTIN PO, Take 10,000 mg by mouth daily., Disp: , Rfl:    docusate sodium (COLACE) 100 MG capsule, Take 1 capsule (100 mg total) by mouth 2 (two) times daily as needed for mild constipation or moderate constipation., Disp: 10 capsule, Rfl: 0   fexofenadine (ALLEGRA) 180 MG tablet, Take 180 mg by mouth daily., Disp: , Rfl:    gabapentin (NEURONTIN) 100 MG capsule, Take 2 capsules (200 mg total) by mouth at bedtime. (Patient not taking: Reported on 11/28/2020), Disp: 180 capsule, Rfl: 3   MAGNESIUM PO, Take 1 tablet by mouth daily., Disp: , Rfl:    oxyCODONE (OXY IR/ROXICODONE) 5 MG immediate release tablet, Take 1-2 tablets (5-10 mg total) by mouth every 6 (six) hours as needed for moderate pain, severe pain or breakthrough pain. (Patient not taking: Reported on 11/28/2020), Disp: 40 tablet, Rfl: 0   traMADol (ULTRAM) 50 MG tablet, Take 1-2 tablets (50-100 mg total) by mouth every 6 (six) hours as needed for moderate pain or severe pain. (Patient not taking: Reported on 11/28/2020), Disp: 40 tablet, Rfl: 0   triamcinolone (NASACORT) 55 MCG/ACT AERO nasal  inhaler, Place 2 sprays into both nostrils daily., Disp: , Rfl:    Vitamin D, Ergocalciferol, (DRISDOL) 1.25 MG (50000 UNIT) CAPS capsule, TAKE 1 CAPSULE (50,000 UNITS TOTAL) BY MOUTH EVERY 7 (SEVEN) DAYS., Disp: 12 capsule, Rfl: 0   Vitamins A & D (VITAMIN A & D) [address] UNITS TABS, Take 1 tablet by mouth daily., Disp: , Rfl:    Objective:     There were no vitals filed for this visit.    There is no height or weight on file to calculate BMI.    Physical Exam:    ***   Electronically signed by:  Amy [facility] 8:56 AM 05/02/23

## 2023-05-09 ENCOUNTER — Ambulatory Visit (INDEPENDENT_AMBULATORY_CARE_PROVIDER_SITE_OTHER): Payer: BC Managed Care – PPO

## 2023-05-09 ENCOUNTER — Ambulatory Visit (INDEPENDENT_AMBULATORY_CARE_PROVIDER_SITE_OTHER): Payer: BC Managed Care – PPO | Admitting: Family Medicine

## 2023-05-09 VITALS — BP 116/80 | HR 92 | Ht 64.0 in

## 2023-05-09 DIAGNOSIS — M546 Pain in thoracic spine: Secondary | ICD-10-CM

## 2023-05-09 MED ORDER — TIZANIDINE HCL 4 MG PO TABS: 2.0000 mg | ORAL_TABLET | Freq: Three times a day (TID) | ORAL | 1 refills | Status: DC | PRN

## 2023-05-09 NOTE — Patient Instructions (Addendum)
Thank you for coming in today.   Max dose of ibuprofen is 800mg  every 8 hours.   Max dose of tylenol arthritis is 2 every 8 hours.   Ok to use tizanidine as needed mostly at bedtime for more significant pain. This medicine is a bit sedating.    Heating pad is ok on the back.   Ice is probably better on that bruise.

## 2023-05-09 NOTE — Progress Notes (Signed)
   Rubin Payor, PhD, LAT, ATC acting as a scribe for Clementeen Graham, MD.  Amy Golden is a 42 y.o. female who presents to [facility] today for back pain. Pt was previously seen by Dr. Jean Rosenthal on 03/14/23 for OMT.  Today, pt c/o worsening back pain after suffering a fall on Saturday night. Pt was going down the stairs and landing on her mid-back and R elbow. Pt locates pain to R-side neck, through trapz, and through thoracic spine w/ radiating pain into bilat ribcage. Pain is causing nausea and decreased appetite.   Radiating pain: yes LE numbness/tingling: yes LE weakness: no Aggravates: too acute.  Treatments tried: IBU  Pertinent review of systems: No fevers or chills  Relevant historical information: Chronic neck pain   Exam:  BP 116/80   Pulse 92   Ht 5\' 4"  (1.626 m)   SpO2 99%   BMI 27.46 kg/m  General: Well Developed, well nourished, and in no acute distress.   MSK: T-spine nontender midline.  Tender palpation left thoracic paraspinal musculature. Decreased thoracic motion.  Intact upper and lower extremities strength.  Intact reflexes.    Lab and Radiology Results  X-ray images T-spine obtained today personally and independently interpreted No acute fractures are visible.  Await formal radiology overread Await formal radiology review    Assessment and Plan: 42 y.o. female with acute exacerbation of chronic thoracic back pain after a fall.  She does have some paresthesias wrapping around her rib area bilaterally that could be thoracic radiculopathy  Today's diagnosis is more contusion and muscle spasm and dysfunction anything else.  Plan for heating pad and muscle relaxer NSAIDs and Tylenol.  We discussed dose of Tylenol and NSAIDs.  If not improving consider physical therapy or even advanced imaging.   PDMP not reviewed this encounter. Orders Placed This Encounter  Procedures   DG Thoracic Spine 2 View    Standing Status:    Future    Number of Occurrences:   1    Expiration Date:   05/08/2024    Reason for Exam (SYMPTOM  OR DIAGNOSIS REQUIRED):   eval pain tspine after fall    Is patient pregnant?:   No    Preferred imaging location?:   [facility] ordered this encounter  Medications   tiZANidine (ZANAFLEX) 4 MG tablet    Sig: Take 0.5-1 tablets (2-4 mg total) by mouth every 8 (eight) hours as needed for muscle spasms.    Dispense:  30 tablet    Refill:  1     Discussed warning signs or symptoms. Please see discharge instructions. Patient expresses understanding.   The above documentation has been reviewed and is accurate and complete Clementeen Graham, M.D.

## 2023-05-12 ENCOUNTER — Ambulatory Visit: Payer: BC Managed Care – PPO | Admitting: Sports Medicine

## 2023-06-08 NOTE — Progress Notes (Unsigned)
    Aleen [facility]ells [facility] [address] [place] [address] Phone: [phone]   Assessment and Plan:     There are no diagnoses linked to this encounter.  ***   Pertinent previous records reviewed include ***    Follow Up: ***     Subjective:   I, Rickie Gange, am serving as a Neurosurgeon for Doctor Richardean Sale  Chief Complaint: MSK    HPI:  11/28/2021 Amy Golden is a 43 y.o. female coming in with complaint of back and neck pain. OMT 09/03/2020.  Patient states that she is having a lot of pain that is causing her to not be able to get comfortable or sleep at night. Pain starts by L clavicle and shoots down L  arm. No weakness or numbness or tingling in L upper extremity.  Patient still states that she notices the pain when she sits down.  Patient denies that any weakness.  States that it can wake her up at night.  States that it does affect daily activities sometimes.  Patient is frustrated because she has not been able to increase her activity at all at the moment.    04/05/2022 Patient states that she needs an adjustment for her back    05/17/2022 Patient states she is good , she has been having dizziness has a ear thing going  on    08/10/2022 Patient states that she is good ,here for an adjustment ,pain in right is coming back but she had a CSI 3 years ago maybe    10/27/2022 Patient states that she is good just an adjustment    01/18/2023 Patient states that she is good neck is flared    03/14/2023 Patient states she is good . She is getting some improvement in the neck    06/09/2023 Patient states  Relevant Historical Information: None pertinent    Additional pertinent review of systems negative.   Current Outpatient Medications:    BIOTIN PO, Take 10,000 mg by mouth daily., Disp: , Rfl:    fexofenadine (ALLEGRA) 180 MG tablet, Take 180 mg by mouth daily., Disp: , Rfl:    tiZANidine (ZANAFLEX) 4 MG tablet, Take 0.5-1  tablets (2-4 mg total) by mouth every 8 (eight) hours as needed for muscle spasms., Disp: 30 tablet, Rfl: 1   Objective:     There were no vitals filed for this visit.    There is no height or weight on file to calculate BMI.    Physical Exam:    ***   Electronically signed by:  Aleen Sells [facility] 7:38 AM 06/08/23

## 2023-06-09 ENCOUNTER — Ambulatory Visit: Payer: BC Managed Care – PPO | Admitting: Sports Medicine

## 2023-06-09 VITALS — BP 122/80 | HR 113 | Ht 64.0 in | Wt 160.0 lb

## 2023-06-09 DIAGNOSIS — M9908 Segmental and somatic dysfunction of rib cage: Secondary | ICD-10-CM

## 2023-06-09 DIAGNOSIS — M9901 Segmental and somatic dysfunction of cervical region: Secondary | ICD-10-CM | POA: Diagnosis not present

## 2023-06-09 DIAGNOSIS — M9902 Segmental and somatic dysfunction of thoracic region: Secondary | ICD-10-CM | POA: Diagnosis not present

## 2023-06-09 DIAGNOSIS — M9903 Segmental and somatic dysfunction of lumbar region: Secondary | ICD-10-CM

## 2023-06-09 DIAGNOSIS — M9905 Segmental and somatic dysfunction of pelvic region: Secondary | ICD-10-CM

## 2023-06-09 DIAGNOSIS — M546 Pain in thoracic spine: Secondary | ICD-10-CM

## 2023-07-21 NOTE — Progress Notes (Unsigned)
    Amy Golden D.[facility] [address] [place] [address] Phone: [phone]   Assessment and Plan:     There are no diagnoses linked to this encounter.  ***   Pertinent previous records reviewed include ***    Follow Up: ***     Subjective:   I, Amy Golden, am serving as a Neurosurgeon for Doctor Richardean Sale  Chief Complaint: MSK    HPI:  11/28/2021 Amy Golden is a 42 y.o. female coming in with complaint of back and neck pain. OMT 09/03/2020.  Patient states that she is having a lot of pain that is causing her to not be able to get comfortable or sleep at night. Pain starts by L clavicle and shoots down L  arm. No weakness or numbness or tingling in L upper extremity.  Patient still states that she notices the pain when she sits down.  Patient denies that any weakness.  States that it can wake her up at night.  States that it does affect daily activities sometimes.  Patient is frustrated because she has not been able to increase her activity at all at the moment.    04/05/2022 Patient states that she needs an adjustment for her back    05/17/2022 Patient states she is good , she has been having dizziness has a ear thing going  on    08/10/2022 Patient states that she is good ,here for an adjustment ,pain in right is coming back but she had a CSI 3 years ago maybe    10/27/2022 Patient states that she is good just an adjustment    01/18/2023 Patient states that she is good neck is flared    03/14/2023 Patient states she is good . She is getting some improvement in the neck    06/09/2023 Patient states she still feels a funny spot in her thoracic   07/22/2023 Patient states   Relevant Historical Information: None pertinent  Additional pertinent review of systems negative.   Current Outpatient Medications:    BIOTIN PO, Take 10,000 mg by mouth daily., Disp: , Rfl:    fexofenadine (ALLEGRA) 180 MG tablet, Take 180 mg by  mouth daily., Disp: , Rfl:    tiZANidine (ZANAFLEX) 4 MG tablet, Take 0.5-1 tablets (2-4 mg total) by mouth every 8 (eight) hours as needed for muscle spasms., Disp: 30 tablet, Rfl: 1   Objective:     There were no vitals filed for this visit.    There is no height or weight on file to calculate BMI.    Physical Exam:    ***   Electronically signed by:  Amy Golden D.[facility] 7:44 AM 07/21/23

## 2023-07-22 ENCOUNTER — Ambulatory Visit: Payer: BC Managed Care – PPO | Admitting: Sports Medicine

## 2023-07-22 VITALS — HR 77 | Ht 64.0 in | Wt 160.0 lb

## 2023-07-22 DIAGNOSIS — M9905 Segmental and somatic dysfunction of pelvic region: Secondary | ICD-10-CM

## 2023-07-22 DIAGNOSIS — M546 Pain in thoracic spine: Secondary | ICD-10-CM | POA: Diagnosis not present

## 2023-07-22 DIAGNOSIS — M9901 Segmental and somatic dysfunction of cervical region: Secondary | ICD-10-CM

## 2023-07-22 DIAGNOSIS — M9903 Segmental and somatic dysfunction of lumbar region: Secondary | ICD-10-CM

## 2023-07-22 DIAGNOSIS — M9902 Segmental and somatic dysfunction of thoracic region: Secondary | ICD-10-CM

## 2023-07-22 DIAGNOSIS — M9908 Segmental and somatic dysfunction of rib cage: Secondary | ICD-10-CM

## 2023-08-19 NOTE — Progress Notes (Signed)
 Ben Shakeerah Gradel [name] [address] [place] [address] Phone: [phone]   Assessment and Plan:     1. Pain in thoracic spine (Primary) 2. Somatic dysfunction of cervical region 3. Somatic dysfunction of thoracic region 4. Somatic dysfunction of lumbar region 5. Somatic dysfunction of pelvic region 6. Somatic dysfunction of rib region 7.  Neck pain - Chronic with exacerbation, subsequent visit - Recurrence of musculoskeletal pain, most prominent in left-sided neck today - Start HEP for neck - Patient has received relief with OMT in the past.  Elects for repeat OMT today.  Tolerated well per note below. - Decision today to treat with OMT was based on Physical Exam   After verbal consent patient was treated with HVLA (high velocity low amplitude), ME (muscle energy), FPR (flex positional release), ST (soft tissue), PC/PD (Pelvic Compression/ Pelvic Decompression) techniques in cervical, rib, thoracic, lumbar, and pelvic areas. Patient tolerated the procedure well with improvement in symptoms.  Patient educated on potential side effects of soreness and recommended to rest, hydrate, and use Tylenol as needed for pain control.   15 additional minutes spent for educating Therapeutic Home Exercise Program.  This included exercises focusing on stretching, strengthening, with focus on eccentric aspects.   Long term goals include an improvement in range of motion, strength, endurance as well as avoiding reinjury. Patient's frequency would include in 1-2 times a day, 3-5 times a week for a duration of 6-12 weeks. Proper technique shown and discussed handout in great detail with ATC.  All questions were discussed and answered.    Pertinent previous records reviewed include none  Follow Up: 4 weeks for reevaluation.  Could consider repeat OMT   Subjective:   I, Moenique Parris, am serving as a Neurosurgeon for Doctor Ulysees Gander  Chief Complaint: MSK    HPI:   11/28/2021 SKILAR MARCOU is a 43 y.o. female coming in with complaint of back and neck pain. OMT 09/03/2020.  Patient states that she is having a lot of pain that is causing her to not be able to get comfortable or sleep at night. Pain starts by L clavicle and shoots down L  arm. No weakness or numbness or tingling in L upper extremity.  Patient still states that she notices the pain when she sits down.  Patient denies that any weakness.  States that it can wake her up at night.  States that it does affect daily activities sometimes.  Patient is frustrated because she has not been able to increase her activity at all at the moment.    04/05/2022 Patient states that she needs an adjustment for her back    05/17/2022 Patient states she is good , she has been having dizziness has a ear thing going  on    08/10/2022 Patient states that she is good ,here for an adjustment ,pain in right is coming back but she had a CSI 3 years ago maybe    10/27/2022 Patient states that she is good just an adjustment    01/18/2023 Patient states that she is good neck is flared    03/14/2023 Patient states she is good . She is getting some improvement in the neck    06/09/2023 Patient states she still feels a funny spot in her thoracic    07/22/2023 Patient states back is still tight . She has been able to play   08/22/2023 Patient states she is okay . Spot on her neck that is  irritated   Relevant Historical Information: None pertinent Additional pertinent review of systems negative.  Current Outpatient Medications  Medication Sig Dispense Refill   BIOTIN PO Take 10,000 mg by mouth daily.     fexofenadine (ALLEGRA) 180 MG tablet Take 180 mg by mouth daily.     tiZANidine (ZANAFLEX) 4 MG tablet Take 0.5-1 tablets (2-4 mg total) by mouth every 8 (eight) hours as needed for muscle spasms. 30 tablet 1   No current facility-administered medications for this visit.      Objective:     Vitals:   08/22/23 0831   Pulse: 84  SpO2: 97%  Weight: 160 lb (72.6 kg)  Height: 5\' 4"  (1.626 m)      Body mass index is 27.46 kg/m.    Physical Exam:     General: Well-appearing, cooperative, sitting comfortably in no acute distress.   OMT Physical Exam:  ASIS Compression Test: Positive Right Cervical: TTP paraspinal, C3 RRSL SR Rib: Bilateral elevated first rib with TTP Thoracic: TTP paraspinal, T4 RRSR, T6-8 RRSL Lumbar: TTP paraspinal, L2 RLSL Pelvis: Right anterior innominate  Electronically signed by:  Marshall Skeeter [name] 8:49 AM 08/22/23

## 2023-08-22 ENCOUNTER — Ambulatory Visit: Admitting: Sports Medicine

## 2023-08-22 VITALS — HR 84 | Ht 64.0 in | Wt 160.0 lb

## 2023-08-22 DIAGNOSIS — M542 Cervicalgia: Secondary | ICD-10-CM

## 2023-08-22 DIAGNOSIS — M9905 Segmental and somatic dysfunction of pelvic region: Secondary | ICD-10-CM

## 2023-08-22 DIAGNOSIS — M546 Pain in thoracic spine: Secondary | ICD-10-CM

## 2023-08-22 DIAGNOSIS — M9902 Segmental and somatic dysfunction of thoracic region: Secondary | ICD-10-CM

## 2023-08-22 DIAGNOSIS — M9903 Segmental and somatic dysfunction of lumbar region: Secondary | ICD-10-CM | POA: Diagnosis not present

## 2023-08-22 DIAGNOSIS — M9908 Segmental and somatic dysfunction of rib cage: Secondary | ICD-10-CM

## 2023-08-22 DIAGNOSIS — M9901 Segmental and somatic dysfunction of cervical region: Secondary | ICD-10-CM | POA: Diagnosis not present

## 2023-08-22 NOTE — Patient Instructions (Signed)
 Neck HEP  4 week follow up

## 2023-09-22 NOTE — Progress Notes (Unsigned)
    Amy Golden D.[facility] [address] [place] [address] Phone: [phone]   Assessment and Plan:     There are no diagnoses linked to this encounter.  ***   Pertinent previous records reviewed include ***    Follow Up: ***     Subjective:   I, Amy Golden, am serving as a Neurosurgeon for Doctor Ulysees Gander  Chief Complaint: MSK    HPI:  11/28/2021 Amy Golden is a 43 y.o. female coming in with complaint of back and neck pain. OMT 09/03/2020.  Patient states that she is having a lot of pain that is causing her to not be able to get comfortable or sleep at night. Pain starts by L clavicle and shoots down L  arm. No weakness or numbness or tingling in L upper extremity.  Patient still states that she notices the pain when she sits down.  Patient denies that any weakness.  States that it can wake her up at night.  States that it does affect daily activities sometimes.  Patient is frustrated because she has not been able to increase her activity at all at the moment.    04/05/2022 Patient states that she needs an adjustment for her back    05/17/2022 Patient states she is good , she has been having dizziness has a ear thing going  on    08/10/2022 Patient states that she is good ,here for an adjustment ,pain in right is coming back but she had a CSI 3 years ago maybe    10/27/2022 Patient states that she is good just an adjustment    01/18/2023 Patient states that she is good neck is flared    03/14/2023 Patient states she is good . She is getting some improvement in the neck    06/09/2023 Patient states she still feels a funny spot in her thoracic    07/22/2023 Patient states back is still tight . She has been able to play    08/22/2023 Patient states she is okay . Spot on her neck that is irritated  09/23/2023 Patient states   Relevant Historical Information: None pertinent  Additional pertinent review of systems  negative.   Current Outpatient Medications:    BIOTIN PO, Take 10,000 mg by mouth daily., Disp: , Rfl:    fexofenadine (ALLEGRA) 180 MG tablet, Take 180 mg by mouth daily., Disp: , Rfl:    tiZANidine  (ZANAFLEX ) 4 MG tablet, Take 0.5-1 tablets (2-4 mg total) by mouth every 8 (eight) hours as needed for muscle spasms., Disp: 30 tablet, Rfl: 1   Objective:     There were no vitals filed for this visit.    There is no height or weight on file to calculate BMI.    Physical Exam:    ***   Electronically signed by:  Marshall Skeeter D.[facility] 7:49 AM 09/22/23

## 2023-09-23 ENCOUNTER — Ambulatory Visit: Admitting: Sports Medicine

## 2023-09-23 VITALS — BP 110/78 | HR 76 | Ht 64.0 in

## 2023-09-23 DIAGNOSIS — M9902 Segmental and somatic dysfunction of thoracic region: Secondary | ICD-10-CM

## 2023-09-23 DIAGNOSIS — M9905 Segmental and somatic dysfunction of pelvic region: Secondary | ICD-10-CM

## 2023-09-23 DIAGNOSIS — M546 Pain in thoracic spine: Secondary | ICD-10-CM | POA: Diagnosis not present

## 2023-09-23 DIAGNOSIS — M9901 Segmental and somatic dysfunction of cervical region: Secondary | ICD-10-CM

## 2023-09-23 DIAGNOSIS — M542 Cervicalgia: Secondary | ICD-10-CM

## 2023-09-23 DIAGNOSIS — M9908 Segmental and somatic dysfunction of rib cage: Secondary | ICD-10-CM

## 2023-09-23 DIAGNOSIS — M9903 Segmental and somatic dysfunction of lumbar region: Secondary | ICD-10-CM

## 2023-10-20 NOTE — Progress Notes (Signed)
 [name] [name] [address] [place] [address] Phone: [phone]   Assessment and Plan:     1. Pain in thoracic spine 2. Neck pain 3. Somatic dysfunction of cervical region 4. Somatic dysfunction of thoracic region 5. Somatic dysfunction of lumbar region 6. Somatic dysfunction of pelvic region 7. Somatic dysfunction of rib region - Chronic with exacerbation, subsequent visit - Recurrence of multiple areas of musculoskeletal pain, with most prominent being in upper back, neck.  Overall, musculoskeletal pains are controlled with intermittent meloxicam , muscle relaxers, OMT, HEP - May continue tizanidine  as needed for muscle spasms - Use meloxicam  15 mg daily as needed for pain.  Recommend limiting chronic NSAIDs to 1-2 doses per week to prevent long-term side effects.  - Patient has received relief with OMT in the past.  Elects for repeat OMT today.  Tolerated well per note below. - Decision today to treat with OMT was based on Physical Exam   After verbal consent patient was treated with HVLA (high velocity low amplitude), ME (muscle energy), FPR (flex positional release), ST (soft tissue), PC/PD (Pelvic Compression/ Pelvic Decompression) techniques in cervical, rib, thoracic, lumbar, and pelvic areas. Patient tolerated the procedure well with improvement in symptoms.  Patient educated on potential side effects of soreness and recommended to rest, hydrate, and use Tylenol  as needed for pain control.   Pertinent previous records reviewed include none  Follow Up: 4 weeks after returning from trip to [place].  Could consider repeat OMT   Subjective:   I, Amy Golden am a scribe for Dr. Cleora Golden.   Chief Complaint: MSK  HPI:   11/28/2021 Amy Golden is a 43 y.o. female coming in with complaint of back and neck pain. OMT 09/03/2020.  Patient states that she is having a lot of pain that is causing her to not be able to get comfortable or  sleep at night. Pain starts by L clavicle and shoots down L  arm. No weakness or numbness or tingling in L upper extremity.  Patient still states that she notices the pain when she sits down.  Patient denies that any weakness.  States that it can wake her up at night.  States that it does affect daily activities sometimes.  Patient is frustrated because she has not been able to increase her activity at all at the moment.    04/05/2022 Patient states that she needs an adjustment for her back    05/17/2022 Patient states she is good , she has been having dizziness has a ear thing going  on    08/10/2022 Patient states that she is good ,here for an adjustment ,pain in right is coming back but she had a CSI 3 years ago maybe    10/27/2022 Patient states that she is good just an adjustment    01/18/2023 Patient states that she is good neck is flared    03/14/2023 Patient states she is good . She is getting some improvement in the neck    06/09/2023 Patient states she still feels a funny spot in her thoracic    07/22/2023 Patient states back is still tight . She has been able to play    08/22/2023 Patient states she is okay . Spot on her neck that is irritated   09/23/2023 Patient states still the neck and middle back that are the worst.   10/21/23 Patient states neck and back are the same. Was completely immobile for three days after  getting up out of the couch last week or two weeks ago. Took muscle relaxer that she had on reserve.   Relevant Historical Information: none pertinent   Additional pertinent review of systems negative.  Current Outpatient Medications  Medication Sig Dispense Refill   BIOTIN PO Take 10,000 mg by mouth daily.     fexofenadine (ALLEGRA) 180 MG tablet Take 180 mg by mouth daily.     tiZANidine  (ZANAFLEX ) 4 MG tablet Take 0.5-1 tablets (2-4 mg total) by mouth every 8 (eight) hours as needed for muscle spasms. 30 tablet 1   No current facility-administered  medications for this visit.      Objective:     Vitals:   10/21/23 0843  BP: 102/60  Pulse: 85  SpO2: 96%  Height: 5' 4 (1.626 m)      Body mass index is 27.46 kg/m.    Physical Exam:     General: Well-appearing, cooperative, sitting comfortably in no acute distress.   OMT Physical Exam:  ASIS Compression Test: Positive Right Cervical: TTP paraspinal, C3-5 RRSR Rib: Bilateral elevated first rib with TTP Thoracic: TTP paraspinal, T4-7 RRSL Lumbar: TTP paraspinal, L1-3 RRSL Pelvis: Right anterior innominate  Electronically signed by:  Marshall Skeeter [name] 9:21 AM 10/21/23

## 2023-10-21 ENCOUNTER — Ambulatory Visit (INDEPENDENT_AMBULATORY_CARE_PROVIDER_SITE_OTHER): Admitting: Sports Medicine

## 2023-10-21 VITALS — BP 102/60 | HR 85 | Ht 64.0 in

## 2023-10-21 DIAGNOSIS — M9903 Segmental and somatic dysfunction of lumbar region: Secondary | ICD-10-CM

## 2023-10-21 DIAGNOSIS — M9902 Segmental and somatic dysfunction of thoracic region: Secondary | ICD-10-CM | POA: Diagnosis not present

## 2023-10-21 DIAGNOSIS — M9901 Segmental and somatic dysfunction of cervical region: Secondary | ICD-10-CM | POA: Diagnosis not present

## 2023-10-21 DIAGNOSIS — M9905 Segmental and somatic dysfunction of pelvic region: Secondary | ICD-10-CM | POA: Diagnosis not present

## 2023-10-21 DIAGNOSIS — M9908 Segmental and somatic dysfunction of rib cage: Secondary | ICD-10-CM

## 2023-10-21 DIAGNOSIS — M542 Cervicalgia: Secondary | ICD-10-CM | POA: Diagnosis not present

## 2023-10-21 DIAGNOSIS — M546 Pain in thoracic spine: Secondary | ICD-10-CM | POA: Diagnosis not present

## 2023-12-01 ENCOUNTER — Ambulatory Visit (INDEPENDENT_AMBULATORY_CARE_PROVIDER_SITE_OTHER)

## 2023-12-01 ENCOUNTER — Ambulatory Visit: Admitting: Sports Medicine

## 2023-12-01 VITALS — HR 84 | Ht 64.0 in | Wt 160.0 lb

## 2023-12-01 DIAGNOSIS — M25532 Pain in left wrist: Secondary | ICD-10-CM | POA: Diagnosis not present

## 2023-12-01 NOTE — Patient Instructions (Signed)
 Wrist HEP  Use thumb spica splint as needed  Voltaren gel over areas of pain  - Use meloxicam  15 mg daily as needed for pain.  Recommend limiting chronic NSAIDs to 1-2 doses per week to prevent long-term side effects. 1-2 week follow up MSK

## 2023-12-01 NOTE — Progress Notes (Signed)
 [facility] [address] [place] [address] Phone: [phone]   Assessment and Plan:     1. Left wrist pain -Chronic with exacerbation, initial sports medicine visit - Continued and ongoing left wrist pain after fall onto outstretched hand - X-ray obtained in clinic.  My interpretation: No acute fracture or dislocation.  Small cortical density over ulnar styloid. - Use meloxicam  15 mg daily as needed for pain.  Recommend limiting chronic NSAIDs to 1-2 doses per week to prevent long-term side effects. - Start HEP for wrist - May continue physical activity as tolerated - Recommend topical Voltaren gel as needed - Recommend using thumb spica brace if pain flares for rest  15 additional minutes spent for educating Therapeutic Home Exercise Program.  This included exercises focusing on stretching, strengthening, with focus on eccentric aspects.   Long term goals include an improvement in range of motion, strength, endurance as well as avoiding reinjury. Patient's frequency would include in 1-2 times a day, 3-5 times a week for a duration of 6-12 weeks. Proper technique shown and discussed handout in great detail with ATC.  All questions were discussed and answered.    Pertinent previous records reviewed include none  Follow Up: Within the next 1 to 2 weeks to discuss ongoing additional musculoskeletal pain and could discuss repeat OMT.   Subjective:   I, Amy Golden, am serving as a Neurosurgeon for Amy Golden  Chief Complaint: MSK   HPI:    11/28/2021 Amy Golden is a 43 y.o. female coming in with complaint of back and neck pain. OMT 09/03/2020.  Patient states that she is having a lot of pain that is causing her to not be able to get comfortable or sleep at night. Pain starts by L clavicle and shoots down L  arm. No weakness or numbness or tingling in L upper extremity.  Patient still states that she notices the pain when she sits  down.  Patient denies that any weakness.  States that it can wake her up at night.  States that it does affect daily activities sometimes.  Patient is frustrated because she has not been able to increase her activity at all at the moment.    04/05/2022 Patient states that she needs an adjustment for her back    05/17/2022 Patient states she is good , she has been having dizziness has a ear thing going  on    08/10/2022 Patient states that she is good ,here for an adjustment ,pain in right is coming back but she had a CSI 3 years ago maybe    10/27/2022 Patient states that she is good just an adjustment    01/18/2023 Patient states that she is good neck is flared    03/14/2023 Patient states she is good . She is getting some improvement in the neck    06/09/2023 Patient states she still feels a funny spot in her thoracic    07/22/2023 Patient states back is still tight . She has been able to play    08/22/2023 Patient states she is okay . Spot on her neck that is irritated   09/23/2023 Patient states still the neck and middle back that are the worst.    10/21/23 Patient states neck and back are the same. Was completely immobile for three days after getting up out of the couch last week or two weeks ago. Took muscle relaxer that she had on reserve.  12/01/2023 Patient states she is good. Wants you to look at left wrist  Relevant Historical Information: none pertinent   Additional pertinent review of systems negative.  Current Outpatient Medications  Medication Sig Dispense Refill   BIOTIN PO Take 10,000 mg by mouth daily.     fexofenadine (ALLEGRA) 180 MG tablet Take 180 mg by mouth daily.     tiZANidine  (ZANAFLEX ) 4 MG tablet Take 0.5-1 tablets (2-4 mg total) by mouth every 8 (eight) hours as needed for muscle spasms. 30 tablet 1   No current facility-administered medications for this visit.      Objective:     Vitals:   12/01/23 1555  Pulse: 84  SpO2: 98%  Weight: 160 lb  (72.6 kg)  Height: 5' 4 (1.626 m)      Body mass index is 27.46 kg/m.    Physical Exam:     General: Appears well, nad, nontoxic and pleasant Neuro:sensation intact, strength is 5/5 in upper extremities, muscle tone wnl Skin:no susupicious lesions or rashes   Left hand/Wrist:   No deformity or swelling appreciated. ROM  Ext 90, flexion 70, radial/ulnar deviation 30 TTP snuffbox, radial styloid nttp over the  , dorsal carpals, volar carpals,  , ulnar styloid, 1st mcp, tfcc Negative Tinel's, Phalen's, Prayer Tests Negative finklestein Neg tfcc bounce test No pain with resisted ext, flex or deviation   Electronically signed by:  Odis Golden D.[facility] 4:09 PM 12/01/23

## 2023-12-09 ENCOUNTER — Ambulatory Visit: Payer: Self-pay | Admitting: Sports Medicine

## 2023-12-23 NOTE — Progress Notes (Unsigned)
    Ben Jackson D.[facility] [address] [place] [address] Phone: [phone]   Assessment and Plan:     There are no diagnoses linked to this encounter.  ***   Pertinent previous records reviewed include ***    Follow Up: ***     Subjective:   I, Amy Golden, am serving as a Neurosurgeon for Doctor Morene Mace  Chief Complaint: MSK   HPI:    11/28/2021 Amy Golden is a 43 y.o. female coming in with complaint of back and neck pain. OMT 09/03/2020.  Patient states that she is having a lot of pain that is causing her to not be able to get comfortable or sleep at night. Pain starts by L clavicle and shoots down L  arm. No weakness or numbness or tingling in L upper extremity.  Patient still states that she notices the pain when she sits down.  Patient denies that any weakness.  States that it can wake her up at night.  States that it does affect daily activities sometimes.  Patient is frustrated because she has not been able to increase her activity at all at the moment.    04/05/2022 Patient states that she needs an adjustment for her back    05/17/2022 Patient states she is good , she has been having dizziness has a ear thing going  on    08/10/2022 Patient states that she is good ,here for an adjustment ,pain in right is coming back but she had a CSI 3 years ago maybe    10/27/2022 Patient states that she is good just an adjustment    01/18/2023 Patient states that she is good neck is flared    03/14/2023 Patient states she is good . She is getting some improvement in the neck    06/09/2023 Patient states she still feels a funny spot in her thoracic    07/22/2023 Patient states back is still tight . She has been able to play    08/22/2023 Patient states she is okay . Spot on her neck that is irritated   09/23/2023 Patient states still the neck and middle back that are the worst.    10/21/23 Patient states neck and back are the  same. Was completely immobile for three days after getting up out of the couch last week or two weeks ago. Took muscle relaxer that she had on reserve.    12/01/2023 Patient states she is good. Wants you to look at left wrist  12/26/2023 Patient states   Relevant Historical Information: none pertinent   Additional pertinent review of systems negative.   Current Outpatient Medications:    BIOTIN PO, Take 10,000 mg by mouth daily., Disp: , Rfl:    fexofenadine (ALLEGRA) 180 MG tablet, Take 180 mg by mouth daily., Disp: , Rfl:    tiZANidine  (ZANAFLEX ) 4 MG tablet, Take 0.5-1 tablets (2-4 mg total) by mouth every 8 (eight) hours as needed for muscle spasms., Disp: 30 tablet, Rfl: 1   Objective:     There were no vitals filed for this visit.    There is no height or weight on file to calculate BMI.    Physical Exam:    ***   Electronically signed by:  Odis Mace D.[facility] 7:37 AM 12/23/23

## 2023-12-26 ENCOUNTER — Ambulatory Visit (INDEPENDENT_AMBULATORY_CARE_PROVIDER_SITE_OTHER): Admitting: Sports Medicine

## 2023-12-26 VITALS — BP 100/72 | HR 74 | Ht 64.0 in

## 2023-12-26 DIAGNOSIS — M542 Cervicalgia: Secondary | ICD-10-CM | POA: Diagnosis not present

## 2023-12-26 DIAGNOSIS — M9901 Segmental and somatic dysfunction of cervical region: Secondary | ICD-10-CM

## 2023-12-26 DIAGNOSIS — M9903 Segmental and somatic dysfunction of lumbar region: Secondary | ICD-10-CM | POA: Diagnosis not present

## 2023-12-26 DIAGNOSIS — M546 Pain in thoracic spine: Secondary | ICD-10-CM | POA: Diagnosis not present

## 2023-12-26 DIAGNOSIS — M9905 Segmental and somatic dysfunction of pelvic region: Secondary | ICD-10-CM | POA: Diagnosis not present

## 2023-12-26 DIAGNOSIS — M9902 Segmental and somatic dysfunction of thoracic region: Secondary | ICD-10-CM

## 2023-12-26 DIAGNOSIS — M25532 Pain in left wrist: Secondary | ICD-10-CM | POA: Diagnosis not present

## 2023-12-26 DIAGNOSIS — M9908 Segmental and somatic dysfunction of rib cage: Secondary | ICD-10-CM

## 2024-02-03 NOTE — Progress Notes (Signed)
 Ben Alazne Quant D.[facility] [address] [place] [address] Phone: [phone]   Assessment and Plan:     1. Neck pain (Primary) 2. Pain in thoracic spine 3. Somatic dysfunction of cervical region 4. Somatic dysfunction of thoracic region 5. Somatic dysfunction of lumbar region 6. Somatic dysfunction of pelvic region 7. Somatic dysfunction of rib region -Chronic with exacerbation, subsequent visit - Overall improvement in multiple areas of musculoskeletal pain, however recurrent flares and primarily neck since previous office visit.  Likely due to increased travel - Continue HEP - Use meloxicam  15 mg daily as needed for breakthrough pain.  Recommend limiting chronic NSAIDs to 1-2 doses per week to prevent long-term side effects. Use Tylenol  500 to 1000 mg tablets 2-3 times a day as needed for day-to-day pain relief.    - Patient has received relief with OMT in the past.  Elects for repeat OMT today.  Tolerated well per note below. - Decision today to treat with OMT was based on Physical Exam  After verbal consent patient was treated with HVLA (high velocity low amplitude), ME (muscle energy), FPR (flex positional release), ST (soft tissue), PC/PD (Pelvic Compression/ Pelvic Decompression) techniques in cervical, rib, thoracic, lumbar, and pelvic areas. Patient tolerated the procedure well with improvement in symptoms.  Patient educated on potential side effects of soreness and recommended to rest, hydrate, and use Tylenol  as needed for pain control.     Pertinent previous records reviewed include none   Follow Up: 3 to 6 weeks for reevaluation.  Could consider repeat OMT   Subjective:   I, Moenique Parris, am serving as a Neurosurgeon for Doctor Morene Mace  Chief Complaint: MSK   HPI:    11/28/2021 GOLDYE TOURANGEAU is a 43 y.o. female coming in with complaint of back and neck pain. OMT 09/03/2020.  Patient states that she is having a lot of  pain that is causing her to not be able to get comfortable or sleep at night. Pain starts by L clavicle and shoots down L  arm. No weakness or numbness or tingling in L upper extremity.  Patient still states that she notices the pain when she sits down.  Patient denies that any weakness.  States that it can wake her up at night.  States that it does affect daily activities sometimes.  Patient is frustrated because she has not been able to increase her activity at all at the moment.    04/05/2022 Patient states that she needs an adjustment for her back    05/17/2022 Patient states she is good , she has been having dizziness has a ear thing going  on    08/10/2022 Patient states that she is good ,here for an adjustment ,pain in right is coming back but she had a CSI 3 years ago maybe    10/27/2022 Patient states that she is good just an adjustment    01/18/2023 Patient states that she is good neck is flared    03/14/2023 Patient states she is good . She is getting some improvement in the neck    06/09/2023 Patient states she still feels a funny spot in her thoracic    07/22/2023 Patient states back is still tight . She has been able to play    08/22/2023 Patient states she is okay . Spot on her neck that is irritated   09/23/2023 Patient states still the neck and middle back that are the worst.  10/21/23 Patient states neck and back are the same. Was completely immobile for three days after getting up out of the couch last week or two weeks ago. Took muscle relaxer that she had on reserve.    12/01/2023 Patient states she is good. Wants you to look at left wrist   12/26/2023 Patient states that her back is doing well.    Does still have pain in wrist from time to time.   02/06/2024 Patient states neck is tight    Relevant Historical Information: none pertinent   Additional pertinent review of systems negative.   Current Outpatient Medications:    BIOTIN PO, Take 10,000 mg by  mouth daily., Disp: , Rfl:    fexofenadine (ALLEGRA) 180 MG tablet, Take 180 mg by mouth daily., Disp: , Rfl:    tiZANidine  (ZANAFLEX ) 4 MG tablet, Take 0.5-1 tablets (2-4 mg total) by mouth every 8 (eight) hours as needed for muscle spasms., Disp: 30 tablet, Rfl: 1   Objective:     Vitals:   02/06/24 0859  BP: 122/84  Pulse: 91  SpO2: 97%  Weight: 160 lb (72.6 kg)  Height: 5' 4 (1.626 m)      Body mass index is 27.46 kg/m.    Physical Exam:    General: Well-appearing, cooperative, sitting comfortably in no acute distress.   OMT Physical Exam:  ASIS Compression Test: Positive Right Cervical: TTP paraspinal, C3-5 RRSR Rib: Bilateral elevated first rib with TTP, worse on right Thoracic: TTP paraspinal, T6 RRSR Lumbar: TTP paraspinal, L1-3 RRSL Pelvis: Right anterior innominate    Electronically signed by:  Odis Mace D.[facility] 9:43 AM 02/06/24

## 2024-02-06 ENCOUNTER — Ambulatory Visit: Admitting: Sports Medicine

## 2024-02-06 VITALS — BP 122/84 | HR 91 | Ht 64.0 in | Wt 160.0 lb

## 2024-02-06 DIAGNOSIS — M546 Pain in thoracic spine: Secondary | ICD-10-CM

## 2024-02-06 DIAGNOSIS — M9908 Segmental and somatic dysfunction of rib cage: Secondary | ICD-10-CM

## 2024-02-06 DIAGNOSIS — M9901 Segmental and somatic dysfunction of cervical region: Secondary | ICD-10-CM | POA: Diagnosis not present

## 2024-02-06 DIAGNOSIS — M9902 Segmental and somatic dysfunction of thoracic region: Secondary | ICD-10-CM

## 2024-02-06 DIAGNOSIS — M9905 Segmental and somatic dysfunction of pelvic region: Secondary | ICD-10-CM

## 2024-02-06 DIAGNOSIS — M9903 Segmental and somatic dysfunction of lumbar region: Secondary | ICD-10-CM | POA: Diagnosis not present

## 2024-02-06 DIAGNOSIS — M542 Cervicalgia: Secondary | ICD-10-CM

## 2024-03-12 NOTE — Progress Notes (Signed)
 Amy Golden [facility] [address] [place] [address] Phone: [phone]   Assessment and Plan:     1. Neck pain (Primary) 2. Pain in thoracic spine 3. Somatic dysfunction of cervical region 4. Somatic dysfunction of thoracic region 5. Somatic dysfunction of lumbar region 6. Somatic dysfunction of pelvic region 7. Somatic dysfunction of rib region -Chronic with exacerbation, subsequent visit - Recurrence of musculoskeletal pain, primarily in neck consistent with muscular strain - Use meloxicam  15 mg daily as needed for breakthrough pain.  Recommend limiting chronic NSAIDs to 1-2 doses per week to prevent long-term side effects. Use Tylenol  500 to 1000 mg tablets 2-3 times a day as needed for day-to-day pain relief.    - Restart tizanidine  4 mg 2-3 times daily as needed for muscle spasms.  Refill provided - Continue HEP - Patient has received relief with OMT in the past.  Elects for repeat OMT today.  Tolerated well per note below. - Decision today to treat with OMT was based on Physical Exam   After verbal consent patient was treated with HVLA (high velocity low amplitude), ME (muscle energy), FPR (flex positional release), ST (soft tissue), PC/PD (Pelvic Compression/ Pelvic Decompression) techniques in cervical, rib, thoracic, lumbar, and pelvic areas. Patient tolerated the procedure well with improvement in symptoms.  Patient educated on potential side effects of soreness and recommended to rest, hydrate, and use Tylenol  as needed for pain control.   Pertinent previous records reviewed include none  Follow Up: 4 to 6 weeks for reevaluation.  Could consider repeat OMT   Subjective:   I, Amy Golden, am serving as a neurosurgeon for Doctor Morene Mace  Chief Complaint: MSK   HPI:    11/28/2021 Amy Golden is a 42 y.o. female coming in with complaint of back and neck pain. OMT 09/03/2020.  Patient states that she is having a lot of pain that is  causing her to not be able to get comfortable or sleep at night. Pain starts by L clavicle and shoots down L  arm. No weakness or numbness or tingling in L upper extremity.  Patient still states that she notices the pain when she sits down.  Patient denies that any weakness.  States that it can wake her up at night.  States that it does affect daily activities sometimes.  Patient is frustrated because she has not been able to increase her activity at all at the moment.    04/05/2022 Patient states that she needs an adjustment for her back    05/17/2022 Patient states she is good , she has been having dizziness has a ear thing going  on    08/10/2022 Patient states that she is good ,here for an adjustment ,pain in right is coming back but she had a CSI 3 years ago maybe    10/27/2022 Patient states that she is good just an adjustment    01/18/2023 Patient states that she is good neck is flared    03/14/2023 Patient states she is good . She is getting some improvement in the neck    06/09/2023 Patient states she still feels a funny spot in her thoracic    07/22/2023 Patient states back is still tight . She has been able to play    08/22/2023 Patient states she is okay . Spot on her neck that is irritated   09/23/2023 Patient states still the neck and middle back that are the worst.  10/21/23 Patient states neck and back are the same. Was completely immobile for three days after getting up out of the couch last week or two weeks ago. Took muscle relaxer that she had on reserve.    12/01/2023 Patient states she is good. Wants you to look at left wrist   12/26/2023 Patient states that her back is doing well.    Does still have pain in wrist from time to time.    02/06/2024 Patient states neck is tight   03/19/2024 Patient states here for adjustment . Neck is tight    Relevant Historical Information: none pertinent   Additional pertinent review of systems negative.  Current  Outpatient Medications  Medication Sig Dispense Refill   meloxicam  (MOBIC ) 15 MG tablet Take 1 tablet (15 mg total) by mouth daily as needed for pain. 30 tablet 0   BIOTIN PO Take 10,000 mg by mouth daily.     fexofenadine (ALLEGRA) 180 MG tablet Take 180 mg by mouth daily.     tiZANidine  (ZANAFLEX ) 4 MG tablet Take 0.5-1 tablets (2-4 mg total) by mouth every 8 (eight) hours as needed for muscle spasms. 30 tablet 1   No current facility-administered medications for this visit.      Objective:     Vitals:   03/19/24 0859  Pulse: 93  SpO2: 96%  Weight: 152 lb (68.9 kg)  Height: 5' 4 (1.626 m)      Body mass index is 26.09 kg/m.    Physical Exam:     General: Well-appearing, cooperative, sitting comfortably in no acute distress.   OMT Physical Exam:  ASIS Compression Test: Positive Right Cervical: TTP paraspinal, C3-5 RRSR Rib: Bilateral elevated first rib with TTP, worse on right Thoracic: TTP paraspinal, T6 RRSR Lumbar: TTP paraspinal, L2 RRSR Pelvis: Right anterior innominate  Electronically signed by:  Odis Mace [facility] 9:20 AM 03/19/24

## 2024-03-19 ENCOUNTER — Ambulatory Visit (INDEPENDENT_AMBULATORY_CARE_PROVIDER_SITE_OTHER): Admitting: Sports Medicine

## 2024-03-19 VITALS — HR 93 | Ht 64.0 in | Wt 152.0 lb

## 2024-03-19 DIAGNOSIS — M9901 Segmental and somatic dysfunction of cervical region: Secondary | ICD-10-CM | POA: Diagnosis not present

## 2024-03-19 DIAGNOSIS — M9905 Segmental and somatic dysfunction of pelvic region: Secondary | ICD-10-CM

## 2024-03-19 DIAGNOSIS — M546 Pain in thoracic spine: Secondary | ICD-10-CM | POA: Diagnosis not present

## 2024-03-19 DIAGNOSIS — M542 Cervicalgia: Secondary | ICD-10-CM | POA: Diagnosis not present

## 2024-03-19 DIAGNOSIS — M9903 Segmental and somatic dysfunction of lumbar region: Secondary | ICD-10-CM

## 2024-03-19 DIAGNOSIS — M9902 Segmental and somatic dysfunction of thoracic region: Secondary | ICD-10-CM | POA: Diagnosis not present

## 2024-03-19 DIAGNOSIS — M9908 Segmental and somatic dysfunction of rib cage: Secondary | ICD-10-CM

## 2024-03-19 MED ORDER — MELOXICAM 15 MG PO TABS: 15.0000 mg | ORAL_TABLET | Freq: Every day | ORAL | 0 refills | Status: AC | PRN

## 2024-03-19 MED ORDER — TIZANIDINE HCL 4 MG PO TABS: 2.0000 mg | ORAL_TABLET | Freq: Three times a day (TID) | ORAL | 1 refills | Status: AC | PRN

## 2024-04-12 NOTE — Progress Notes (Unsigned)
 [facility] [address] [place] [address] Phone: [phone]   Assessment and Plan:     1. Neck pain (Primary) 2. Pain in thoracic spine 3. Somatic dysfunction of cervical region 4. Somatic dysfunction of thoracic region 5. Somatic dysfunction of lumbar region 6. Somatic dysfunction of pelvic region 7. Somatic dysfunction of rib region -Chronic with exacerbation, subsequent visit - Overall improvement in multiple areas of musculoskeletal pain, with mild flare and upper back pain that is since improving - Use meloxicam  15 mg daily as needed for breakthrough pain.  Recommend limiting chronic NSAIDs to 1-2 doses per week to prevent long-term side effects. Use Tylenol  500 to 1000 mg tablets 2-3 times a day as needed for day-to-day pain relief.    - Continue tizanidine  4 mg 2-3 times daily as needed for muscle spasms - Continue HEP - Patient has received relief with OMT in the past.  Elects for repeat OMT today.  Tolerated well per note below. - Decision today to treat with OMT was based on Physical Exam   After verbal consent patient was treated with HVLA (high velocity low amplitude), ME (muscle energy), FPR (flex positional release), ST (soft tissue), PC/PD (Pelvic Compression/ Pelvic Decompression) techniques in cervical, rib, thoracic, lumbar, and pelvic areas. Patient tolerated the procedure well with improvement in symptoms.  Patient educated on potential side effects of soreness and recommended to rest, hydrate, and use Tylenol  as needed for pain control.   Pertinent previous records reviewed include none  Follow Up: 4 to 6 weeks for reevaluation.  Could consider repeat OMT   Subjective:   I, [name], am serving as a neurosurgeon for Doctor Morene Mace  Chief Complaint: MSK   HPI:    11/28/2021 Amy Golden is a 43 y.o. female coming in with complaint of back and neck pain. OMT 09/03/2020.  Patient states that she is having a  lot of pain that is causing her to not be able to get comfortable or sleep at night. Pain starts by L clavicle and shoots down L  arm. No weakness or numbness or tingling in L upper extremity.  Patient still states that she notices the pain when she sits down.  Patient denies that any weakness.  States that it can wake her up at night.  States that it does affect daily activities sometimes.  Patient is frustrated because she has not been able to increase her activity at all at the moment.    04/05/2022 Patient states that she needs an adjustment for her back    05/17/2022 Patient states she is good , she has been having dizziness has a ear thing going  on    08/10/2022 Patient states that she is good ,here for an adjustment ,pain in right is coming back but she had a CSI 3 years ago maybe    10/27/2022 Patient states that she is good just an adjustment    01/18/2023 Patient states that she is good neck is flared    03/14/2023 Patient states she is good . She is getting some improvement in the neck    06/09/2023 Patient states she still feels a funny spot in her thoracic    07/22/2023 Patient states back is still tight . She has been able to play    08/22/2023 Patient states she is okay . Spot on her neck that is irritated   09/23/2023 Patient states still the neck and middle back that are  the worst.    10/21/23 Patient states neck and back are the same. Was completely immobile for three days after getting up out of the couch last week or two weeks ago. Took muscle relaxer that she had on reserve.    12/01/2023 Patient states she is good. Wants you to look at left wrist   12/26/2023 Patient states that her back is doing well.    Does still have pain in wrist from time to time.    02/06/2024 Patient states neck is tight    03/19/2024 Patient states here for adjustment . Neck is tight   04/13/2024 Patient states she is good    Relevant Historical Information: none pertinent    Additional pertinent review of systems negative.  Current Outpatient Medications  Medication Sig Dispense Refill   BIOTIN PO Take 10,000 mg by mouth daily.     fexofenadine (ALLEGRA) 180 MG tablet Take 180 mg by mouth daily.     meloxicam  (MOBIC ) 15 MG tablet Take 1 tablet (15 mg total) by mouth daily as needed for pain. 30 tablet 0   tiZANidine  (ZANAFLEX ) 4 MG tablet Take 0.5-1 tablets (2-4 mg total) by mouth every 8 (eight) hours as needed for muscle spasms. 30 tablet 1   No current facility-administered medications for this visit.      Objective:     Vitals:   04/13/24 1102  BP: 120/76  Pulse: (!) 120  SpO2: 97%  Weight: 152 lb (68.9 kg)  Height: 5' 4 (1.626 m)      Body mass index is 26.09 kg/m.    Physical Exam:     General: Well-appearing, cooperative, sitting comfortably in no acute distress.   OMT Physical Exam:  ASIS Compression Test: Positive Right Cervical: TTP paraspinal, C4 RRSL Rib: Bilateral elevated first rib with TTP Thoracic: TTP paraspinal, T6 RRSL, T9 RLSL Lumbar: TTP paraspinal, L1-3 RRSL Pelvis: Right anterior innominate  Electronically signed by:  Odis Mace D[facility] 11:42 AM 04/13/24

## 2024-04-13 ENCOUNTER — Ambulatory Visit: Admitting: Sports Medicine

## 2024-04-13 VITALS — BP 120/76 | HR 120 | Ht 64.0 in | Wt 152.0 lb

## 2024-04-13 DIAGNOSIS — M9902 Segmental and somatic dysfunction of thoracic region: Secondary | ICD-10-CM

## 2024-04-13 DIAGNOSIS — M542 Cervicalgia: Secondary | ICD-10-CM

## 2024-04-13 DIAGNOSIS — M546 Pain in thoracic spine: Secondary | ICD-10-CM

## 2024-04-13 DIAGNOSIS — M9908 Segmental and somatic dysfunction of rib cage: Secondary | ICD-10-CM

## 2024-04-13 DIAGNOSIS — M9905 Segmental and somatic dysfunction of pelvic region: Secondary | ICD-10-CM

## 2024-04-13 DIAGNOSIS — M9901 Segmental and somatic dysfunction of cervical region: Secondary | ICD-10-CM

## 2024-04-13 DIAGNOSIS — M9903 Segmental and somatic dysfunction of lumbar region: Secondary | ICD-10-CM

## 2024-04-27 NOTE — Progress Notes (Unsigned)
 "  Amy Golden D.[facility] [address] [place] [address] Phone: [phone]   Assessment and Plan:     1. Neck pain (Primary) 2. Pain in thoracic spine 3. Somatic dysfunction of cervical region 4. Somatic dysfunction of thoracic region 5. Somatic dysfunction of lumbar region 6. Somatic dysfunction of pelvic region 7. Somatic dysfunction of rib region -Chronic with exacerbation, subsequent visit - Overall improvement in multiple areas of musculoskeletal pain, especially in the neck regions.  Mild recurrent flare of right lower back pain with increased driving over the past 2 weeks - Use meloxicam  15 mg daily as needed for breakthrough pain.  Recommend limiting chronic NSAIDs to 1-2 doses per week to prevent long-term side effects. Use Tylenol  500 to 1000 mg tablets 2-3 times a day as needed for day-to-day pain relief.    - Patient has received relief with OMT in the past.  Elects for repeat OMT today.  Tolerated well per note below. - Decision today to treat with OMT was based on Physical Exam   After verbal consent patient was treated with HVLA (high velocity low amplitude), ME (muscle energy), FPR (flex positional release), ST (soft tissue), PC/PD (Pelvic Compression/ Pelvic Decompression) techniques in cervical, rib, thoracic, lumbar, and pelvic areas. Patient tolerated the procedure well with improvement in symptoms.  Patient educated on potential side effects of soreness and recommended to rest, hydrate, and use Tylenol  as needed for pain control.   Pertinent previous records reviewed include none  Follow Up: 4 to 8 weeks for reevaluation.  Could consider repeat OMT   Subjective:   I, Amy Golden, am serving as a neurosurgeon for Doctor Amy Golden  Chief Complaint: MSK   HPI:    11/28/2021 Amy Golden is a 43 y.o. female coming in with complaint of back and neck pain. OMT 09/03/2020.  Patient states that she is having a lot of pain that is  causing her to not be able to get comfortable or sleep at night. Pain starts by L clavicle and shoots down L  arm. No weakness or numbness or tingling in L upper extremity.  Patient still states that she notices the pain when she sits down.  Patient denies that any weakness.  States that it can wake her up at night.  States that it does affect daily activities sometimes.  Patient is frustrated because she has not been able to increase her activity at all at the moment.    04/05/2022 Patient states that she needs an adjustment for her back    05/17/2022 Patient states she is good , she has been having dizziness has a ear thing going  on    08/10/2022 Patient states that she is good ,here for an adjustment ,pain in right is coming back but she had a CSI 3 years ago maybe    10/27/2022 Patient states that she is good just an adjustment    01/18/2023 Patient states that she is good neck is flared    03/14/2023 Patient states she is good . She is getting some improvement in the neck    06/09/2023 Patient states she still feels a funny spot in her thoracic    07/22/2023 Patient states back is still tight . She has been able to play    08/22/2023 Patient states she is okay . Spot on her neck that is irritated   09/23/2023 Patient states still the neck and middle back that are the worst.  10/21/23 Patient states neck and back are the same. Was completely immobile for three days after getting up out of the couch last week or two weeks ago. Took muscle relaxer that she had on reserve.    12/01/2023 Patient states she is good. Wants you to look at left wrist   12/26/2023 Patient states that her back is doing well.    Does still have pain in wrist from time to time.    02/06/2024 Patient states neck is tight    03/19/2024 Patient states here for adjustment . Neck is tight    04/13/2024 Patient states she is good    04/30/2024 Patient states she is good   Relevant Historical Information:  none pertinent     Additional pertinent review of systems negative.  Current Outpatient Medications  Medication Sig Dispense Refill   BIOTIN PO Take 10,000 mg by mouth daily.     fexofenadine (ALLEGRA) 180 MG tablet Take 180 mg by mouth daily.     meloxicam  (MOBIC ) 15 MG tablet Take 1 tablet (15 mg total) by mouth daily as needed for pain. 30 tablet 0   tiZANidine  (ZANAFLEX ) 4 MG tablet Take 0.5-1 tablets (2-4 mg total) by mouth every 8 (eight) hours as needed for muscle spasms. 30 tablet 1   No current facility-administered medications for this visit.      Objective:     Vitals:   04/30/24 0857  Pulse: 68  SpO2: 95%  Weight: 152 lb (68.9 kg)  Height: 5' 4 (1.626 m)      Body mass index is 26.09 kg/m.    Physical Exam:     General: Well-appearing, cooperative, sitting comfortably in no acute distress.   OMT Physical Exam:  ASIS Compression Test: Positive Right Cervical: TTP paraspinal, C3-5 RRSR Rib: Bilateral elevated first rib with TTP, worse on right Thoracic: TTP paraspinal, T6 RRSR Lumbar: TTP paraspinal, L1-3 RRSL Pelvis: Right anterior innominate  Electronically signed by:  Amy Golden [facility] 9:18 AM 04/30/2024 "

## 2024-04-30 ENCOUNTER — Ambulatory Visit (INDEPENDENT_AMBULATORY_CARE_PROVIDER_SITE_OTHER): Admitting: Sports Medicine

## 2024-04-30 VITALS — HR 68 | Ht 64.0 in | Wt 152.0 lb

## 2024-04-30 DIAGNOSIS — M9902 Segmental and somatic dysfunction of thoracic region: Secondary | ICD-10-CM

## 2024-04-30 DIAGNOSIS — M542 Cervicalgia: Secondary | ICD-10-CM | POA: Diagnosis not present

## 2024-04-30 DIAGNOSIS — M9901 Segmental and somatic dysfunction of cervical region: Secondary | ICD-10-CM | POA: Diagnosis not present

## 2024-04-30 DIAGNOSIS — M546 Pain in thoracic spine: Secondary | ICD-10-CM | POA: Diagnosis not present

## 2024-04-30 DIAGNOSIS — M9903 Segmental and somatic dysfunction of lumbar region: Secondary | ICD-10-CM

## 2024-04-30 DIAGNOSIS — M9905 Segmental and somatic dysfunction of pelvic region: Secondary | ICD-10-CM | POA: Diagnosis not present

## 2024-04-30 DIAGNOSIS — M9908 Segmental and somatic dysfunction of rib cage: Secondary | ICD-10-CM

## 2024-06-25 ENCOUNTER — Ambulatory Visit: Admitting: Sports Medicine
